# Patient Record
Sex: Male | Born: 1950
Health system: Southern US, Community
[De-identification: ages and names within clinical notes are randomized; demographics above are authoritative.]

## PROBLEM LIST (undated history)

## (undated) DIAGNOSIS — I2510 Atherosclerotic heart disease of native coronary artery without angina pectoris: Secondary | ICD-10-CM

## (undated) DIAGNOSIS — F329 Major depressive disorder, single episode, unspecified: Secondary | ICD-10-CM

## (undated) DIAGNOSIS — K219 Gastro-esophageal reflux disease without esophagitis: Secondary | ICD-10-CM

## (undated) DIAGNOSIS — G4730 Sleep apnea, unspecified: Secondary | ICD-10-CM

## (undated) DIAGNOSIS — I5189 Other ill-defined heart diseases: Secondary | ICD-10-CM

## (undated) DIAGNOSIS — I499 Cardiac arrhythmia, unspecified: Secondary | ICD-10-CM

## (undated) DIAGNOSIS — Z8719 Personal history of other diseases of the digestive system: Secondary | ICD-10-CM

## (undated) DIAGNOSIS — F419 Anxiety disorder, unspecified: Secondary | ICD-10-CM

## (undated) DIAGNOSIS — I119 Hypertensive heart disease without heart failure: Secondary | ICD-10-CM

## (undated) DIAGNOSIS — I10 Essential (primary) hypertension: Secondary | ICD-10-CM

## (undated) DIAGNOSIS — F32A Depression, unspecified: Secondary | ICD-10-CM

## (undated) HISTORY — DX: Atherosclerotic heart disease of native coronary artery without angina pectoris: I25.10

## (undated) HISTORY — DX: Other ill-defined heart diseases: I51.89

## (undated) HISTORY — PX: COLONOSCOPY: SHX174

## (undated) HISTORY — DX: Morbid (severe) obesity due to excess calories: E66.01

## (undated) HISTORY — PX: MOUTH SURGERY: SHX715

## (undated) HISTORY — PX: UPPER GI ENDOSCOPY: SHX6162

## (undated) HISTORY — DX: Hypertensive heart disease without heart failure: I11.9

---

## 1980-01-17 HISTORY — PX: GASTRIC BYPASS: SHX52

## 2001-08-08 ENCOUNTER — Encounter: Payer: Self-pay | Admitting: Internal Medicine

## 2001-08-08 ENCOUNTER — Ambulatory Visit (HOSPITAL_COMMUNITY): Admission: RE | Admit: 2001-08-08 | Discharge: 2001-08-08 | Payer: Self-pay | Admitting: Internal Medicine

## 2002-08-26 ENCOUNTER — Ambulatory Visit (HOSPITAL_COMMUNITY): Admission: RE | Admit: 2002-08-26 | Discharge: 2002-08-26 | Payer: Self-pay | Admitting: *Deleted

## 2002-08-26 ENCOUNTER — Encounter (INDEPENDENT_AMBULATORY_CARE_PROVIDER_SITE_OTHER): Payer: Self-pay | Admitting: Specialist

## 2007-01-24 ENCOUNTER — Ambulatory Visit (HOSPITAL_COMMUNITY): Admission: RE | Admit: 2007-01-24 | Discharge: 2007-01-24 | Payer: Self-pay | Admitting: *Deleted

## 2007-01-24 ENCOUNTER — Encounter (INDEPENDENT_AMBULATORY_CARE_PROVIDER_SITE_OTHER): Payer: Self-pay | Admitting: *Deleted

## 2007-03-27 ENCOUNTER — Ambulatory Visit (HOSPITAL_COMMUNITY): Admission: RE | Admit: 2007-03-27 | Discharge: 2007-03-27 | Payer: Self-pay | Admitting: *Deleted

## 2007-04-16 ENCOUNTER — Encounter: Admission: RE | Admit: 2007-04-16 | Discharge: 2007-04-16 | Payer: Self-pay | Admitting: *Deleted

## 2010-05-31 NOTE — Op Note (Signed)
NAME:  Gary [name], Gary [name]                ACCOUNT NO.:  [id number]   MEDICAL RECORD NO.:  [id number]          PATIENT TYPE:  AMB   LOCATION:  ENDO                         FACILITY:  [facility]   PHYSICIAN:  Georgiana Spinner, M.D.    DATE OF BIRTH:  [birth year]   DATE OF PROCEDURE:  01/24/2007  DATE OF DISCHARGE:                               OPERATIVE REPORT   PROCEDURE:  Colonoscopy.   INDICATIONS:  Colon polyps, colon cancer screening.   ANESTHESIA:  Fentanyl 25 mcg, Versed 2.5 mg, Benadryl 12.5 mg.   PROCEDURE:  With the patient mildly sedated in the left lateral  decubitus position, a rectal examination was attempted.  Subsequently,  the Pentax videoscopic colonoscope was inserted into the rectum and  passed under direct vision to the cecum identified by ileocecal valve  and appendiceal orifice, both of which were photographed.  From this  point the colonoscope was slowly withdrawn taking circumferential views  of colonic mucosa, stopping at 40 cm from the anal verge at which point  polyp was seen, photographed and removed using snare cautery technique  setting of 20/150 blended current.  We then suctioned this into the  endoscope for retrieval through a tissue trap and withdrew taking  circumferential views of the remaining colonic mucosa, stopping in the  rectum which appeared normal on direct and showed hemorrhoids on  retroflexed view.  The endoscope was straightened and withdrawn.  The  patient's vital signs and pulse oximeter remained stable.  The patient  tolerated the procedure well without apparent complication.   FINDINGS:  Polyp at 40 cm from the anal verge.  Await biopsy report.  The patient will call me for results and follow-up with me as an  outpatient.           ______________________________  Georgiana Spinner, M.D.     GMO/MEDQ  D:  01/24/2007  T:  01/24/2007  Job:  [id number]

## 2010-05-31 NOTE — Op Note (Signed)
NAME:  Gary [name], Gary [name]                ACCOUNT NO.:  [id number]   MEDICAL RECORD NO.:  [id number]          PATIENT TYPE:  AMB   LOCATION:  ENDO                         FACILITY:  [facility]   PHYSICIAN:  Georgiana Spinner, M.D.    DATE OF BIRTH:  [birth year]   DATE OF PROCEDURE:  01/24/2007  DATE OF DISCHARGE:                               OPERATIVE REPORT   PROCEDURE:  Upper endoscopy.   INDICATIONS:  GERD.   ANESTHESIA:  Fentanyl 150 mcg; Versed 15 mg; Benadryl 25 mg.   PROCEDURE:  With the patient mildly sedated in the left lateral  decubitus position, the Pentax videoscopic endoscope was inserted in the  mouth and passed under direct vision through the esophagus, which  appeared normal until we reached distal esophagus.  There was one area  of prominence at the squamocolumnar junction that I elected to  photograph and biopsy.  We entered into the stomach.  The patient had  previously a gastric bypass, and we were able to get through the banded  area into what was then a fairly copious stomach and in this area was  the pylorus which I could not reach with the scope but was photographed  from a distance only.  We placed the endoscope in retroflexion to view  the stomach from below and then withdrew the endoscope, taking  circumferential views of the remaining gastric and esophageal mucosa.  The patient's vital signs and pulse oximeter remained stable.  The  patient tolerated the procedure well without apparent complications.   FINDINGS:  Prominent area of squamocolumnar junction biopsied.  The  patient is status post gastric bypass surgery which at this point  appears to have failed incompletely.  I could not reach the duodenum  with the scope.   PLAN:  Await biopsy report.  The patient will call me for results and  follow up with me as an outpatient.  Proceed to colonoscopy as planned.           ______________________________  Georgiana Spinner, M.D.     GMO/MEDQ  D:  01/24/2007  T:   01/24/2007  Job:  [id number]

## 2010-06-03 NOTE — Op Note (Signed)
   NAMEMERRIT, Gary [name]                        ACCOUNT NO.:  [id number]   MEDICAL RECORD NO.:  [id number]                   PATIENT TYPE:  AMB   LOCATION:  ENDO                                 FACILITY:  [facility]   PHYSICIAN:  Georgiana Spinner, M.D.                 DATE OF BIRTH:  14-Jan-1951   DATE OF PROCEDURE:  08/26/2002  DATE OF DISCHARGE:                                 OPERATIVE REPORT   PROCEDURE:  Upper endoscopy.   INDICATIONS FOR PROCEDURE:  Gastroesophageal reflux disease.   ANESTHESIA:  Demerol 90, Versed 9 mg.   DESCRIPTION OF PROCEDURE:  With the patient mildly sedated in the left  lateral decubitus position the Olympus videoscopic endoscope was inserted in  the mouth and passed under direct vision through the esophagus. There was a  question of Barrett's which was seen, photographed and subsequently  biopsied.   We entered into the stomach  and there was a residual pouch from the  previous gastric bypass surgery. This was photographed. We were able to  enter into the remainder of the stomach  fairly easily and the fundus, body  and antrum were visualized and I could not get the scope to advance further  to see the duodenal bulb or 2nd portion of the duodenum.   The endoscope was then therefore  pulled back and placed in retroflexion and  viewed the stomach  from below and there was seen to be divided into parts  and this was photographed. The endoscope was then straightened and  withdrawn, taking circumferential  views of the remaining gastric and  esophageal  mucosa.   The patient's vital signs  and pulse oximeter remained stable. The patient  tolerated the procedure well without apparent complications.   FINDINGS:  The patient is status post  gastric pouch surgery. Otherwise an  unremarkable stomach. There appears to be an area of short segments  Barrett's esophagus which was photographed and biopsied.   PLAN:  Await biopsy report. The patient will call me for  results and follow  up with me as an outpatient.                                               Georgiana Spinner, M.D.    GMO/MEDQ  D:  08/26/2002  T:  08/26/2002  Job:  [id number]

## 2011-04-19 ENCOUNTER — Institutional Professional Consult (permissible substitution): Payer: Self-pay | Admitting: Pulmonary Disease

## 2013-07-25 ENCOUNTER — Encounter (HOSPITAL_BASED_OUTPATIENT_CLINIC_OR_DEPARTMENT_OTHER): Payer: Self-pay | Admitting: *Deleted

## 2013-07-25 ENCOUNTER — Other Ambulatory Visit: Payer: Self-pay | Admitting: Orthopedic Surgery

## 2013-07-25 NOTE — Progress Notes (Signed)
Does not use a cpap-he is supposed to -for reg finger cyst-needs bmet-ekg-will ck with dr crews

## 2013-07-28 ENCOUNTER — Encounter (HOSPITAL_BASED_OUTPATIENT_CLINIC_OR_DEPARTMENT_OTHER)
Admission: RE | Admit: 2013-07-28 | Discharge: 2013-07-28 | Disposition: A | Discharge status: Home / Self Care | Payer: BC Managed Care – PPO | Source: Ambulatory Visit | Attending: Orthopedic Surgery | Admitting: Orthopedic Surgery

## 2013-07-28 ENCOUNTER — Other Ambulatory Visit: Payer: Self-pay

## 2013-07-28 DIAGNOSIS — Z01818 Encounter for other preprocedural examination: Secondary | ICD-10-CM | POA: Insufficient documentation

## 2013-07-28 DIAGNOSIS — Z01812 Encounter for preprocedural laboratory examination: Secondary | ICD-10-CM | POA: Insufficient documentation

## 2013-07-28 DIAGNOSIS — Z01810 Encounter for preprocedural cardiovascular examination: Secondary | ICD-10-CM | POA: Insufficient documentation

## 2013-07-28 LAB — BASIC METABOLIC PANEL
Anion gap: 13 (ref 5–15)
BUN: 19 mg/dL (ref 6–23)
CHLORIDE: 101 meq/L (ref 96–112)
CO2: 25 meq/L (ref 19–32)
Calcium: 9 mg/dL (ref 8.4–10.5)
Creatinine, Ser: 1.18 mg/dL (ref 0.50–1.35)
GFR calc Af Amer: 75 mL/min — ABNORMAL LOW (ref >90)
GFR calc non Af Amer: 65 mL/min — ABNORMAL LOW (ref >90)
Glucose, Bld: 96 mg/dL (ref 70–99)
POTASSIUM: 4.2 meq/L (ref 3.7–5.3)
Sodium: 139 mEq/L (ref 137–147)

## 2013-07-28 NOTE — Progress Notes (Signed)
Dr crews ok with pt here

## 2013-07-30 ENCOUNTER — Ambulatory Visit (HOSPITAL_BASED_OUTPATIENT_CLINIC_OR_DEPARTMENT_OTHER)
Admission: RE | Admit: 2013-07-30 | Payer: BC Managed Care – PPO | Source: Ambulatory Visit | Admitting: Orthopedic Surgery

## 2013-07-30 HISTORY — DX: Depression, unspecified: F32.A

## 2013-07-30 HISTORY — DX: Gastro-esophageal reflux disease without esophagitis: K21.9

## 2013-07-30 HISTORY — DX: Anxiety disorder, unspecified: F41.9

## 2013-07-30 HISTORY — DX: Sleep apnea, unspecified: G47.30

## 2013-07-30 HISTORY — DX: Essential (primary) hypertension: I10

## 2013-07-30 HISTORY — DX: Major depressive disorder, single episode, unspecified: F32.9

## 2013-07-30 SURGERY — EXCISION MASS
Anesthesia: Regional | Site: Finger | Laterality: Right

## 2013-10-21 ENCOUNTER — Encounter (HOSPITAL_BASED_OUTPATIENT_CLINIC_OR_DEPARTMENT_OTHER): Payer: BC Managed Care – PPO | Attending: General Surgery

## 2014-08-04 ENCOUNTER — Ambulatory Visit
Admission: RE | Admit: 2014-08-04 | Discharge: 2014-08-04 | Disposition: A | Discharge status: Home / Self Care | Payer: BLUE CROSS/BLUE SHIELD | Source: Ambulatory Visit | Attending: Cardiology | Admitting: Cardiology

## 2014-08-04 ENCOUNTER — Other Ambulatory Visit: Payer: Self-pay | Admitting: Cardiology

## 2014-08-04 DIAGNOSIS — R0602 Shortness of breath: Secondary | ICD-10-CM

## 2014-08-07 ENCOUNTER — Other Ambulatory Visit: Payer: Self-pay | Admitting: Cardiology

## 2014-08-07 ENCOUNTER — Ambulatory Visit (HOSPITAL_COMMUNITY)
Admission: RE | Admit: 2014-08-07 | Discharge: 2014-08-07 | Disposition: A | Discharge status: Home / Self Care | Payer: BLUE CROSS/BLUE SHIELD | Source: Ambulatory Visit | Attending: Vascular Surgery | Admitting: Vascular Surgery

## 2014-08-07 DIAGNOSIS — R609 Edema, unspecified: Secondary | ICD-10-CM | POA: Diagnosis not present

## 2014-08-07 NOTE — Progress Notes (Signed)
Preliminary results phoned and left on voicemail, no one in office to give preliminary results to. Since results were negative I left them on Kathy(nurse-voicemail).

## 2014-08-17 ENCOUNTER — Other Ambulatory Visit: Payer: Self-pay | Admitting: Cardiology

## 2014-08-19 ENCOUNTER — Other Ambulatory Visit: Payer: Self-pay | Admitting: Cardiology

## 2014-08-19 DIAGNOSIS — I712 Thoracic aortic aneurysm, without rupture: Secondary | ICD-10-CM

## 2014-08-19 DIAGNOSIS — I7121 Aneurysm of the ascending aorta, without rupture: Secondary | ICD-10-CM

## 2014-08-21 ENCOUNTER — Ambulatory Visit
Admission: RE | Admit: 2014-08-21 | Discharge: 2014-08-21 | Disposition: A | Discharge status: Home / Self Care | Payer: BLUE CROSS/BLUE SHIELD | Source: Ambulatory Visit | Attending: Cardiology | Admitting: Cardiology

## 2014-08-21 DIAGNOSIS — I712 Thoracic aortic aneurysm, without rupture: Secondary | ICD-10-CM

## 2014-08-21 DIAGNOSIS — I7121 Aneurysm of the ascending aorta, without rupture: Secondary | ICD-10-CM

## 2014-09-07 ENCOUNTER — Encounter: Payer: Self-pay | Admitting: Cardiology

## 2014-09-07 ENCOUNTER — Other Ambulatory Visit: Payer: Self-pay | Admitting: Cardiology

## 2014-09-07 DIAGNOSIS — Z9884 Bariatric surgery status: Secondary | ICD-10-CM | POA: Insufficient documentation

## 2014-09-07 DIAGNOSIS — R0600 Dyspnea, unspecified: Secondary | ICD-10-CM | POA: Insufficient documentation

## 2014-09-07 DIAGNOSIS — I5189 Other ill-defined heart diseases: Secondary | ICD-10-CM | POA: Insufficient documentation

## 2014-09-07 DIAGNOSIS — I2510 Atherosclerotic heart disease of native coronary artery without angina pectoris: Secondary | ICD-10-CM | POA: Insufficient documentation

## 2014-09-07 DIAGNOSIS — K219 Gastro-esophageal reflux disease without esophagitis: Secondary | ICD-10-CM | POA: Insufficient documentation

## 2014-09-07 DIAGNOSIS — I119 Hypertensive heart disease without heart failure: Secondary | ICD-10-CM | POA: Insufficient documentation

## 2014-09-07 DIAGNOSIS — G4730 Sleep apnea, unspecified: Secondary | ICD-10-CM | POA: Insufficient documentation

## 2014-09-07 HISTORY — DX: Atherosclerotic heart disease of native coronary artery without angina pectoris: I25.10

## 2014-09-07 NOTE — Progress Notes (Unsigned)
Patient ID: Gary [name], male   DOB: December 09, 1950, 64 y.o.   MRN: [id number]   Gary, [name]  Date of visit:  09/07/2014 DOB:  1950-02-27    Age:  63 yrs. Medical record number:  [id number]     Account number:  [id number] Primary Care Provider: Merri Brunette ____________________________ CURRENT DIAGNOSES  1. CAD Native without angina  2. Dyspnea  3. Edema  4. Essential hypertension  5. Hypertensive heart disease without heart failure  6. Morbid (severe) obesity  7. Sleep apnea  8. Gastro-esophageal reflux disease  9. Ascending aortic aneurysm  10. Bariatric Surgery Status  11. Dyspnea ____________________________ ALLERGIES  No Known Allergies ____________________________ MEDICATIONS  1. aspirin 81 mg chewable tablet, 1 p.o. daily  2. multivitamin tablet, 1 p.o. daily  3. Nexium 40 mg capsule,delayed release, 1 p.o. daily  4. lisinopril 20 mg-hydrochlorothiazide 25 mg tablet, 1 p.o. daily  5. meloxicam 15 mg tablet, 1 p.o. daily  6. tramadol 50 mg tablet, PRN  7. rabeprazole 20 mg tablet,delayed release, 1 p.o. daily  8. ferrous sulfate 15 mg iron/1.5 mL oral suspension, 10ml qd  9. furosemide 20 mg tablet, 1 p.o. daily  10. Trintellix 20 mg tablet, 1 p.o. daily  11. atorvastatin 40 mg tablet, 1 p.o. daily ____________________________ CHIEF COMPLAINTS  F/u after ct scan ____________________________ HISTORY OF PRESENT ILLNESS Patient seen for cardiac followup. He continues to have fatigue and difficulty holding up stay mostly in level of activity. He complains of intermittent left arm pain that did not sound anginal to me. He is severely obese weighing over 400 pounds. Recent CT scan showed three-vessel coronary calcification and he also had an aortic root dimension of 3.7 cm suggesting an early aortic aneurysm. He continues to have edema despite taking furosemide 20 mg daily. He has no PND, orthopnea or claudication. Limited as to what he can do. He also had venous Dopplers did  show some reflux but one leg was difficult to image ____________________________ PAST HISTORY  Past Medical Illnesses:  restless legs, sleep apnea, depression, hypertension, GERD, obesity;  Cardiovascular Illnesses:  CAD, aortic aneurysm;  Surgical Procedures:  gastric bypass Rou en Y;  NYHA Classification:  II;  Canadian Angina Classification:  Class 0: Asymptomatic;  Cardiology Procedures-Invasive:  no history of prior cardiac procedures;  Cardiology Procedures-Noninvasive:  treadmill cardiolite;  LVEF of 60% documented via echocardiogram on 08/10/2014,   ____________________________ CARDIO-PULMONARY TEST DATES EKG Date:  08/04/2014;  Echocardiography Date: 08/10/2014;  Chest Xray Date: 08/04/2014;   ____________________________ FAMILY HISTORY Brother -- Brother alive and well Father -- Father dead, Heart disease Mother -- Mother dead, Congestive heart failure, Carcinoma of the pancreas Sister -- Sister alive with problem, Multiple sclerosis ____________________________ SOCIAL HISTORY Alcohol Use:  socially;  Smoking:  used to smoke but quit 2008, cigars;  Diet:  regular diet;  Lifestyle:  single;  Exercise:  no regular exercise;  Occupation:  Radiographer, therapeutic;  Residence:  lives alone;   ____________________________ REVIEW OF SYSTEMS General:  severe obesity, fatigue Respiratory: denies dyspnea, cough, wheezing or hemoptysis. Cardiovascular:  please review HPI Abdominal: dyspepsia and dysphagia Genitourinary-Male: nocturia  Musculoskeletal:  arthritis of the knees Neurological:  headaches Psychiatric:  anxiety, depression  ____________________________ PHYSICAL EXAMINATION VITAL SIGNS  Blood Pressure:  142/80 Sitting, Right arm, large cuff  , 146/86 Standing, Right arm and large cuff   Pulse:  86/min. Weight:  401.00 lbs. Height:  69"BMI: 59  Constitutional:  pleasant white male in no acute distress, severely obese  Skin:  striae over abdomen and arms Head:  normocephalic, normal  hair pattern, no masses or tenderness Neck:  supple, without massess. No JVD, thyromegaly or carotid bruits. Carotid upstroke normal. Chest:  normal symmetry, clear to auscultation. Cardiac:  regular rhythm, normal S1 and S2, No S3 or S4, no murmurs, gallops or rubs detected. Peripheral Pulses:  the femoral,dorsalis pedis, and posterior tibial pulses are full and equal bilaterally with no bruits auscultated. Extremities & Back:  2+ edema Neurological:  no gross motor or sensory deficits noted, affect appropriate, oriented x3. ____________________________ MOST RECENT LIPID PANEL 09/10/13  CHOL TOTL 198 mg/dl, LDL 161 NM, HDL 60 mg/dl and TRIGLYCER 70 mg/dl ____________________________ IMPRESSIONS/PLAN  1. 3 vessel coronary calcification 2. Significant exertional fatigue, malaise, and  dyspnea 3. Severe morbid obesity with weight greater than 400 pounds 4. Hypertensive heart disease with LVH and diastolic dysfunction  Recommendations:  Long discussion about his symptoms. He is severely limited and does have 3 vessel atherosclerosis. He is simply too large for standard stress testing or pharmacologic or perfusion stress testing. He is concerned about his cardiac risk and I told him that in light of his three-vessel calcification and symptoms that he could consider a radial catheterization if the cardiologist but doesn't feels he can safely perform one in him. I've asked for a consultation from the interventional cardiologist to determine if he would be a candidate from a weight and body habitus viewpoint for radial catheterization.  ____________________________ TODAYS ORDERS  1. Cardiology Interventional Schedule ASAP for consultation to determine suitablitlity for radial cath                       ____________________________ Cardiology Physician:  Darden Palmer MD [facility]

## 2014-09-14 ENCOUNTER — Ambulatory Visit (INDEPENDENT_AMBULATORY_CARE_PROVIDER_SITE_OTHER): Payer: BLUE CROSS/BLUE SHIELD | Admitting: Cardiovascular Disease

## 2014-09-14 ENCOUNTER — Encounter: Payer: Self-pay | Admitting: Cardiovascular Disease

## 2014-09-14 ENCOUNTER — Encounter: Payer: Self-pay | Admitting: *Deleted

## 2014-09-14 ENCOUNTER — Ambulatory Visit: Payer: BLUE CROSS/BLUE SHIELD | Admitting: Cardiovascular Disease

## 2014-09-14 VITALS — BP 110/70 | HR 82 | Ht 69.0 in | Wt >= 6400 oz

## 2014-09-14 DIAGNOSIS — R072 Precordial pain: Secondary | ICD-10-CM

## 2014-09-14 DIAGNOSIS — R0600 Dyspnea, unspecified: Secondary | ICD-10-CM | POA: Diagnosis not present

## 2014-09-14 DIAGNOSIS — I25110 Atherosclerotic heart disease of native coronary artery with unstable angina pectoris: Secondary | ICD-10-CM | POA: Diagnosis not present

## 2014-09-14 LAB — CBC
HEMATOCRIT: 35.8 % — AB (ref 39.0–52.0)
HEMOGLOBIN: 11.9 g/dL — AB (ref 13.0–17.0)
MCHC: 33.2 g/dL (ref 30.0–36.0)
MCV: 91.8 fl (ref 78.0–100.0)
Platelets: 172 10*3/uL (ref 150.0–400.0)
RBC: 3.9 Mil/uL — ABNORMAL LOW (ref 4.22–5.81)
RDW: 15.8 % — AB (ref 11.5–15.5)
WBC: 5.7 10*3/uL (ref 4.0–10.5)

## 2014-09-14 LAB — BASIC METABOLIC PANEL
BUN: 16 mg/dL (ref 6–23)
CALCIUM: 9.6 mg/dL (ref 8.4–10.5)
CO2: 30 mEq/L (ref 19–32)
CREATININE: 1.12 mg/dL (ref 0.40–1.50)
Chloride: 100 mEq/L (ref 96–112)
GFR: 70.43 mL/min (ref >60.00)
Glucose, Bld: 98 mg/dL (ref 70–99)
Potassium: 3.8 mEq/L (ref 3.5–5.1)
Sodium: 138 mEq/L (ref 135–145)

## 2014-09-14 LAB — PROTIME-INR
INR: 1 ratio (ref 0.8–1.0)
PROTHROMBIN TIME: 11.2 s (ref 9.6–13.1)

## 2014-09-14 NOTE — Progress Notes (Signed)
Chief Complaint  Patient presents with  . Shortness of Breath      History of Present Illness: 64 yo Gary [name] with history of HTN, LVH, OSA, CAD who is referred today by his cardiologist Dr. Viann Fish to discuss cardiac catheterization from the radial approach. He has not had prior cardiac workup. He describes occasional chest tightness described as a rope around his chest. This occurs at rest and with exertion. He has had progressive dyspnea with exertion and increasing fatigue. He has not worked for the last two months and does not exercise. He weighs over 400 lbs and limited by this.   Primary Cardiologist: Dr. Donnie Aho  Past Medical History  Diagnosis Date  . Depression   . Anxiety   . GERD (gastroesophageal reflux disease)   . Sleep apnea     does not use or have a cpap-he is supposed to use one  . CAD (coronary artery disease), native coronary artery 09/07/2014    3 vessel coronary calcification noted on CT scan 2016   . Hypertensive heart disease without CHF   . Diastolic dysfunction   . Morbid obesity     Past Surgical History  Procedure Laterality Date  . Upper gi endoscopy    . Colonoscopy    . Gastric bypass  1982  . Mouth surgery      Current Outpatient Prescriptions  Medication Sig Dispense Refill  . aspirin EC 81 MG tablet Take 81 mg by mouth daily.    Marland Kitchen atorvastatin (LIPITOR) 40 MG tablet Take 40 mg by mouth daily.    Marland Kitchen esomeprazole (NEXIUM) 20 MG capsule Take 20 mg by mouth daily at 12 noon.    . ferrous sulfate 325 (Gary FE) MG tablet Take 325 mg by mouth daily with breakfast.    . furosemide (LASIX) 20 MG tablet Take 20 mg by mouth daily.    Marland Kitchen lisinopril-hydrochlorothiazide (PRINZIDE,ZESTORETIC) 20-25 MG per tablet Take 1 tablet by mouth daily.    . meloxicam (MOBIC) 15 MG tablet Take 15 mg by mouth daily.    . Multiple Vitamin (MULTIVITAMIN) capsule Take 1 capsule by mouth daily.    Marland Kitchen rOPINIRole (REQUIP) 0.5 MG tablet Take 0.5 mg by mouth 3 (three) times  daily.    . traMADol (ULTRAM) 50 MG tablet Take by mouth every 6 (six) hours as needed.    . Vortioxetine HBr (TRINTELLIX) 20 MG TABS Take 20 mg by mouth daily.     No current facility-administered medications for this visit.    No Known Allergies  Social History   Social History  . Marital Status: Single    Spouse Name: N/A  . Number of Children: N/A  . Years of Education: N/A   Occupational History  . Not on file.   Social History Main Topics  . Smoking status: Former Smoker    Types: Cigars    Quit date: 01/16/2006  . Smokeless tobacco: Never Used  . Alcohol Use: 0.0 oz/week    0 Standard drinks or equivalent per week     Comment: occ  . Drug Use: No  . Sexual Activity: Not on file   Other Topics Concern  . Not on file   Social History Narrative   Lives alone.  Chartered certified accountant for [employer].     Family History  Problem Relation Age of Onset  . Heart attack Father   . Diabetes Father   . Heart failure Mother   . Pancreatic cancer Mother   . Multiple sclerosis  Sister   . Hypertension Maternal Aunt   . Diabetes Maternal Grandmother   . Heart attack Maternal Grandfather   . Diabetes Paternal Grandmother   . Heart attack Paternal Grandfather     Review of Systems:  As stated in the HPI and otherwise negative.   BP 110/70 mmHg  Pulse 82  Ht  (1.753 m)  Wt 410 lb 1.9 oz (186.029 kg)  BMI 60.54 kg/m2  SpO2 97%  Physical Examination: General: Well developed, well nourished, NAD HEENT: OP clear, mucus membranes moist SKIN: warm, dry. No rashes. Neuro: No focal deficits Musculoskeletal: Muscle strength 5/5 all ext Psychiatric: Mood and affect normal Neck: No JVD, no carotid bruits, no thyromegaly, no lymphadenopathy. Lungs:Clear bilaterally, no wheezes, rhonci, crackles Cardiovascular: Regular rate and rhythm. No murmurs, gallops or rubs. Abdomen:Soft. Bowel sounds present. Non-tender.  Extremities: No lower extremity edema. Unable to palpate pulses  due to leg size.   EKG:  EKG is ordered today. The ekg ordered today demonstrates NSR, rate 82 bpm. LAFB. Non-specific ST abnormality.   Recent Labs: No results found for requested labs within last 365 days.     Wt Readings from Last 3 Encounters:  09/14/14 410 lb 1.9 oz (186.029 kg)  07/25/13 385 lb (174.635 kg)     Other studies Reviewed: Additional studies/ records that were reviewed today include: . Review of the above records demonstrates:   Assessment and Plan:   1. CAD/Unstable angina/Dyspnea: He has three vessel CAD noted on CT scan. He is morbidly obese and has other risk factors for CAD including HTN and former tobacco abuse as well as FH of CAD. He has exertional chest pain, fatigue and dyspnea which may represent unstable angina. Will plan left heart catheterization from the right radial approach at [facility] on 09/23/14. Risks and benefits reviewed with pt. He agrees to proceed. Pre-cath labs today.   Current medicines are reviewed at length with the patient today.  The patient does not have concerns regarding medicines.  The following changes have been made:  no change  Labs/ tests ordered today include:   Orders Placed This Encounter  Procedures  . CBC  . Basic Metabolic Panel (BMET)  . INR/PT    Disposition:   FU with Dr. Donnie Aho post cath.   Signed, Verne Carrow, MD 09/14/2014 4:34 PM    [facility] [address] [place], [place], [place]  [address] Phone: [phone]; Fax: [phone]

## 2014-09-14 NOTE — Patient Instructions (Signed)
Medication Instructions:  Your physician recommends that you continue on your current medications as directed. Please refer to the Current Medication list given to you today.   Labwork: Lab work to be done today--CBC, [employer], PT  Testing/Procedures: Your physician has requested that you have a cardiac catheterization. Cardiac catheterization is used to diagnose and/or treat various heart conditions. Doctors may recommend this procedure for a number of different reasons. The most common reason is to evaluate chest pain. Chest pain can be a symptom of coronary artery disease (CAD), and cardiac catheterization can show whether plaque is narrowing or blocking your heart's arteries. This procedure is also used to evaluate the valves, as well as measure the blood flow and oxygen levels in different parts of your heart. For further information please visit [email or web address] Please follow instruction sheet, as given. Scheduled for September 23, 2014      Coronary Angiogram A coronary angiogram, also called coronary angiography, is an X-ray procedure used to look at the arteries in the heart. In this procedure, a dye (contrast dye) is injected through a long, hollow tube (catheter). The catheter is about the size of a piece of cooked spaghetti and is inserted through your groin, wrist, or arm. The dye is injected into each artery, and X-rays are then taken to show if there is a blockage in the arteries of your heart. LET [facility] CARE PROVIDER KNOW ABOUT:  Any allergies you have, including allergies to shellfish or contrast dye.   All medicines you are taking, including vitamins, herbs, eye drops, creams, and over-the-counter medicines.   Previous problems you or members of your family have had with the use of anesthetics.   Any blood disorders you have.   Previous surgeries you have had.  History of kidney problems or failure.   Other medical conditions you have. RISKS AND COMPLICATIONS    Generally, a coronary angiogram is a safe procedure. However, problems can occur and include:  Allergic reaction to the dye.  Bleeding from the access site or other locations.  Kidney injury, especially in people with impaired kidney function.  Stroke (rare).  Heart attack (rare). BEFORE THE PROCEDURE   Do not eat or drink anything after midnight the night before the procedure or as directed by your health care provider.   Ask your health care provider about changing or stopping your regular medicines. This is especially important if you are taking diabetes medicines or blood thinners. PROCEDURE  You may be given a medicine to help you relax (sedative) before the procedure. This medicine is given through an intravenous (IV) access tube that is inserted into one of your veins.   The area where the catheter will be inserted will be washed and shaved. This is usually done in the groin but may be done in the fold of your arm (near your elbow) or in the wrist.   A medicine will be given to numb the area where the catheter will be inserted (local anesthetic).   The health care provider will insert the catheter into an artery. The catheter will be guided by using a special type of X-ray (fluoroscopy) of the blood vessel being examined.   A special dye will then be injected into the catheter, and X-rays will be taken. The dye will help to show where any narrowing or blockages are located in the heart arteries.  AFTER THE PROCEDURE   If the procedure is done through the leg, you will be kept in bed lying flat  for several hours. You will be instructed to not bend or cross your legs.  The insertion site will be checked frequently.   The pulse in your feet or wrist will be checked frequently.   Additional blood tests, X-rays, and an electrocardiogram may be done.  Document Released: 07/09/2002 Document Revised: 05/19/2013 Document Reviewed: 05/27/2012 [facility] Patient  Information 2015 [place], [place]. This information is not intended to replace advice given to you by your health care provider. Make sure you discuss any questions you have with your health care provider.    Any Other Special Instructions Will Be Listed Below (If Applicable).

## 2014-09-15 ENCOUNTER — Ambulatory Visit: Payer: BLUE CROSS/BLUE SHIELD | Admitting: Cardiovascular Disease

## 2014-09-16 NOTE — Addendum Note (Signed)
Addended by: Dossie Arbour on: 09/16/2014 05:51 PM   Modules accepted: Orders

## 2014-09-23 ENCOUNTER — Ambulatory Visit (HOSPITAL_COMMUNITY)
Admission: RE | Admit: 2014-09-23 | Discharge: 2014-09-23 | Disposition: A | Discharge status: Home / Self Care | Payer: BLUE CROSS/BLUE SHIELD | Source: Ambulatory Visit | Attending: Cardiovascular Disease | Admitting: Cardiovascular Disease

## 2014-09-23 ENCOUNTER — Encounter (HOSPITAL_COMMUNITY)
Admission: RE | Disposition: A | Discharge status: Home / Self Care | Payer: Self-pay | Source: Ambulatory Visit | Attending: Cardiovascular Disease

## 2014-09-23 DIAGNOSIS — G4733 Obstructive sleep apnea (adult) (pediatric): Secondary | ICD-10-CM | POA: Diagnosis not present

## 2014-09-23 DIAGNOSIS — Z87891 Personal history of nicotine dependence: Secondary | ICD-10-CM | POA: Insufficient documentation

## 2014-09-23 DIAGNOSIS — I2510 Atherosclerotic heart disease of native coronary artery without angina pectoris: Secondary | ICD-10-CM | POA: Insufficient documentation

## 2014-09-23 DIAGNOSIS — I119 Hypertensive heart disease without heart failure: Secondary | ICD-10-CM | POA: Insufficient documentation

## 2014-09-23 DIAGNOSIS — I25110 Atherosclerotic heart disease of native coronary artery with unstable angina pectoris: Secondary | ICD-10-CM | POA: Insufficient documentation

## 2014-09-23 DIAGNOSIS — Z6841 Body Mass Index (BMI) 40.0 and over, adult: Secondary | ICD-10-CM | POA: Insufficient documentation

## 2014-09-23 DIAGNOSIS — Z7982 Long term (current) use of aspirin: Secondary | ICD-10-CM | POA: Insufficient documentation

## 2014-09-23 DIAGNOSIS — R072 Precordial pain: Secondary | ICD-10-CM | POA: Insufficient documentation

## 2014-09-23 HISTORY — PX: CARDIAC CATHETERIZATION: SHX172

## 2014-09-23 SURGERY — LEFT HEART CATH AND CORONARY ANGIOGRAPHY

## 2014-09-23 MED ORDER — SODIUM CHLORIDE 0.9 % IJ SOLN: 3.0000 mL | Freq: Two times a day (BID) | INTRAMUSCULAR | Status: DC

## 2014-09-23 MED ORDER — HEPARIN SODIUM (PORCINE) 1000 UNIT/ML IJ SOLN
INTRAMUSCULAR | Status: DC | PRN
  Administered 2014-09-23: 7000 [IU] via INTRAVENOUS

## 2014-09-23 MED ORDER — SODIUM CHLORIDE 0.9 % IV SOLN: 250.0000 mL | INTRAVENOUS | Status: DC | PRN

## 2014-09-23 MED ORDER — LIDOCAINE HCL (PF) 1 % IJ SOLN
INTRAMUSCULAR | Status: AC
  Filled 2014-09-23: qty 30

## 2014-09-23 MED ORDER — VERAPAMIL HCL 2.5 MG/ML IV SOLN
INTRAVENOUS | Status: AC
  Filled 2014-09-23: qty 2

## 2014-09-23 MED ORDER — SODIUM CHLORIDE 0.9 % IJ SOLN: 3.0000 mL | INTRAMUSCULAR | Status: DC | PRN

## 2014-09-23 MED ORDER — FENTANYL CITRATE (PF) 100 MCG/2ML IJ SOLN
INTRAMUSCULAR | Status: DC | PRN
  Administered 2014-09-23: 25 ug via INTRAVENOUS

## 2014-09-23 MED ORDER — VERAPAMIL HCL 2.5 MG/ML IV SOLN
INTRAVENOUS | Status: DC | PRN
  Administered 2014-09-23: 14:00:00 via INTRA_ARTERIAL

## 2014-09-23 MED ORDER — HEPARIN (PORCINE) IN NACL 2-0.9 UNIT/ML-% IJ SOLN
INTRAMUSCULAR | Status: AC
  Filled 2014-09-23: qty 1000

## 2014-09-23 MED ORDER — SODIUM CHLORIDE 0.9 % IV SOLN: INTRAVENOUS | Status: AC

## 2014-09-23 MED ORDER — HEPARIN (PORCINE) IN NACL 2-0.9 UNIT/ML-% IJ SOLN
INTRAMUSCULAR | Status: DC | PRN
  Administered 2014-09-23: 14:00:00

## 2014-09-23 MED ORDER — IOHEXOL 350 MG/ML SOLN
INTRAVENOUS | Status: DC | PRN
  Administered 2014-09-23: 125 mL via INTRA_ARTERIAL

## 2014-09-23 MED ORDER — MIDAZOLAM HCL 2 MG/2ML IJ SOLN
INTRAMUSCULAR | Status: AC
  Filled 2014-09-23: qty 4

## 2014-09-23 MED ORDER — ASPIRIN 81 MG PO CHEW: 81.0000 mg | CHEWABLE_TABLET | ORAL | Status: DC

## 2014-09-23 MED ORDER — HEPARIN SODIUM (PORCINE) 1000 UNIT/ML IJ SOLN
INTRAMUSCULAR | Status: AC
  Filled 2014-09-23: qty 1

## 2014-09-23 MED ORDER — SODIUM CHLORIDE 0.9 % IV SOLN
INTRAVENOUS | Status: AC
  Administered 2014-09-23: 13:00:00 via INTRAVENOUS

## 2014-09-23 MED ORDER — FENTANYL CITRATE (PF) 100 MCG/2ML IJ SOLN
INTRAMUSCULAR | Status: AC
  Filled 2014-09-23: qty 4

## 2014-09-23 MED ORDER — MIDAZOLAM HCL 2 MG/2ML IJ SOLN
INTRAMUSCULAR | Status: DC | PRN
  Administered 2014-09-23: 2 mg via INTRAVENOUS

## 2014-09-23 SURGICAL SUPPLY — 13 items
CATH INFINITI 5 FR JL3.5 (CATHETERS) ×2 IMPLANT
CATH INFINITI 5FR ANG PIGTAIL (CATHETERS) ×2 IMPLANT
CATH INFINITI JR4 5F (CATHETERS) ×2 IMPLANT
COVER PRB 48X5XTLSCP FOLD TPE (BAG) ×1 IMPLANT
COVER PROBE 5X48 (BAG) ×1
DEVICE RAD COMP TR BAND LRG (VASCULAR PRODUCTS) ×2 IMPLANT
GLIDESHEATH SLEND SS 6F .021 (SHEATH) ×2 IMPLANT
KIT HEART LEFT (KITS) ×2 IMPLANT
PACK CARDIAC CATHETERIZATION (CUSTOM PROCEDURE TRAY) ×2 IMPLANT
SYR MEDRAD MARK V 150ML (SYRINGE) ×2 IMPLANT
TRANSDUCER W/STOPCOCK (MISCELLANEOUS) ×2 IMPLANT
TUBING CIL FLEX 10 FLL-RA (TUBING) ×2 IMPLANT
WIRE SAFE-T 1.5MM-J .035X260CM (WIRE) ×2 IMPLANT

## 2014-09-23 NOTE — H&P (View-Only) (Signed)
 Chief Complaint  Patient presents with  . Shortness of Breath      History of Present Illness: 64 yo male with history of HTN, LVH, OSA, CAD who is referred today by his cardiologist Dr. Spencer Tilley to discuss cardiac catheterization from the radial approach. He has not had prior cardiac workup. He describes occasional chest tightness described as a rope around his chest. This occurs at rest and with exertion. He has had progressive dyspnea with exertion and increasing fatigue. He has not worked for the last two months and does not exercise. He weighs over 400 lbs and limited by this.   Primary Cardiologist: Dr. Tilley  Past Medical History  Diagnosis Date  . Depression   . Anxiety   . GERD (gastroesophageal reflux disease)   . Sleep apnea     does not use or have a cpap-he is supposed to use one  . CAD (coronary artery disease), native coronary artery 09/07/2014    3 vessel coronary calcification noted on CT scan 2016   . Hypertensive heart disease without CHF   . Diastolic dysfunction   . Morbid obesity     Past Surgical History  Procedure Laterality Date  . Upper gi endoscopy    . Colonoscopy    . Gastric bypass  1982  . Mouth surgery      Current Outpatient Prescriptions  Medication Sig Dispense Refill  . aspirin EC 81 MG tablet Take 81 mg by mouth daily.    . atorvastatin (LIPITOR) 40 MG tablet Take 40 mg by mouth daily.    . esomeprazole (NEXIUM) 20 MG capsule Take 20 mg by mouth daily at 12 noon.    . ferrous sulfate 325 (65 FE) MG tablet Take 325 mg by mouth daily with breakfast.    . furosemide (LASIX) 20 MG tablet Take 20 mg by mouth daily.    . lisinopril-hydrochlorothiazide (PRINZIDE,ZESTORETIC) 20-25 MG per tablet Take 1 tablet by mouth daily.    . meloxicam (MOBIC) 15 MG tablet Take 15 mg by mouth daily.    . Multiple Vitamin (MULTIVITAMIN) capsule Take 1 capsule by mouth daily.    . rOPINIRole (REQUIP) 0.5 MG tablet Take 0.5 mg by mouth 3 (three) times  daily.    . traMADol (ULTRAM) 50 MG tablet Take by mouth every 6 (six) hours as needed.    . Vortioxetine HBr (TRINTELLIX) 20 MG TABS Take 20 mg by mouth daily.     No current facility-administered medications for this visit.    No Known Allergies  Social History   Social History  . Marital Status: Single    Spouse Name: N/A  . Number of Children: N/A  . Years of Education: N/A   Occupational History  . Not on file.   Social History Main Topics  . Smoking status: Former Smoker    Types: Cigars    Quit date: 01/16/2006  . Smokeless tobacco: Never Used  . Alcohol Use: 0.0 oz/week    0 Standard drinks or equivalent per week     Comment: occ  . Drug Use: No  . Sexual Activity: Not on file   Other Topics Concern  . Not on file   Social History Narrative   Lives alone.  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     Family History  Problem Relation Age of Onset  . Heart attack Father   . Diabetes Father   . Heart failure Mother   . Pancreatic cancer Mother   . Multiple sclerosis   Sister   . Hypertension Maternal Aunt   . Diabetes Maternal Grandmother   . Heart attack Maternal Grandfather   . Diabetes Paternal Grandmother   . Heart attack Paternal Grandfather     Review of Systems:  As stated in the HPI and otherwise negative.   BP 110/70 mmHg  Pulse 82  Ht 5' 9" (1.753 m)  Wt 410 lb 1.9 oz (186.029 kg)  BMI 60.54 kg/m2  SpO2 97%  Physical Examination: General: Well developed, well nourished, NAD HEENT: OP clear, mucus membranes moist SKIN: warm, dry. No rashes. Neuro: No focal deficits Musculoskeletal: Muscle strength 5/5 all ext Psychiatric: Mood and affect normal Neck: No JVD, no carotid bruits, no thyromegaly, no lymphadenopathy. Lungs:Clear bilaterally, no wheezes, rhonci, crackles Cardiovascular: Regular rate and rhythm. No murmurs, gallops or rubs. Abdomen:Soft. Bowel sounds present. Non-tender.  Extremities: No lower extremity edema. Unable to palpate pulses  due to leg size.   EKG:  EKG is ordered today. The ekg ordered today demonstrates NSR, rate 82 bpm. LAFB. Non-specific ST abnormality.   Recent Labs: No results found for requested labs within last 365 days.     Wt Readings from Last 3 Encounters:  09/14/14 410 lb 1.9 oz (186.029 kg)  07/25/13 385 lb (174.635 kg)     Other studies Reviewed: Additional studies/ records that were reviewed today include: . Review of the above records demonstrates:   Assessment and Plan:   1. CAD/Unstable angina/Dyspnea: He has three vessel CAD noted on CT scan. He is morbidly obese and has other risk factors for CAD including HTN and former tobacco abuse as well as FH of CAD. He has exertional chest pain, fatigue and dyspnea which may represent unstable angina. Will plan left heart catheterization from the right radial approach at [facility] on 09/23/14. Risks and benefits reviewed with pt. He agrees to proceed. Pre-cath labs today.   Current medicines are reviewed at length with the patient today.  The patient does not have concerns regarding medicines.  The following changes have been made:  no change  Labs/ tests ordered today include:   Orders Placed This Encounter  Procedures  . CBC  . Basic Metabolic Panel (BMET)  . INR/PT    Disposition:   FU with Dr. tilley post cath.   Signed, Christopher McAlhany, MD 09/14/2014 4:34 PM    [facility] [address], [place], [place]  [address] Phone: [phone]; Fax: [phone]     

## 2014-09-23 NOTE — Discharge Instructions (Signed)
Radial Site Care °Refer to this sheet in the next few weeks. These instructions provide you with information on caring for yourself after your procedure. Your caregiver may also give you more specific instructions. Your treatment has been planned according to current medical practices, but problems sometimes occur. Call your caregiver if you have any problems or questions after your procedure. °HOME CARE INSTRUCTIONS °· You may shower the day after the procedure. Remove the bandage (dressing) and gently wash the site with plain soap and water. Gently pat the site dry. °· Do not apply powder or lotion to the site. °· Do not submerge the affected site in water for 3 to 5 days. °· Inspect the site at least twice daily. °· Do not flex or bend the affected arm for 24 hours. °· No lifting over 5 pounds (2.3 kg) for 5 days after your procedure. °· Do not drive home if you are discharged the same day of the procedure. Have someone else drive you. °· You may drive 24 hours after the procedure unless otherwise instructed by your caregiver. °· Do not operate machinery or power tools for 24 hours. °· A responsible adult should be with you for the first 24 hours after you arrive home. °What to expect: °· Any bruising will usually fade within 1 to 2 weeks. °· Blood that collects in the tissue (hematoma) may be painful to the touch. It should usually decrease in size and tenderness within 1 to 2 weeks. °SEEK IMMEDIATE MEDICAL CARE IF: °· You have unusual pain at the radial site. °· You have redness, warmth, swelling, or pain at the radial site. °· You have drainage (other than a small amount of blood on the dressing). °· You have chills. °· You have a fever or persistent symptoms for more than 72 hours. °· You have a fever and your symptoms suddenly get worse. °· Your arm becomes pale, cool, tingly, or numb. °· You have heavy bleeding from the site. Hold pressure on the site and call 911. °Document Released: 02/04/2010 Document  Revised: 03/27/2011 Document Reviewed: 02/04/2010 °ExitCare® Patient Information ©2015 [employer]. This information is not intended to replace advice given to you by your health care provider. Make sure you discuss any questions you have with your health care provider. ° °

## 2014-09-23 NOTE — Interval H&P Note (Signed)
History and Physical Interval Note:  09/23/2014 11:31 AM  Gary [name]  has presented today for cardiac cath with the diagnosis of unstable angina. The various methods of treatment have been discussed with the patient and family. After consideration of risks, benefits and other options for treatment, the patient has consented to  Procedure(s): Left Heart Cath and Coronary Angiography (N/A) as a surgical intervention .  The patient's history has been reviewed, patient examined, no change in status, stable for surgery.  I have reviewed the patient's chart and labs.  Questions were answered to the patient's satisfaction.    Cath Lab Visit (complete for each Cath Lab visit)  Clinical Evaluation Leading to the Procedure:   ACS: No.  Non-ACS:    Anginal Classification: CCS III  Anti-ischemic medical therapy: No Therapy  Non-Invasive Test Results: No non-invasive testing performed  Prior CABG: No previous CABG         Gary [name]

## 2014-09-24 ENCOUNTER — Encounter (HOSPITAL_COMMUNITY): Payer: Self-pay | Admitting: Cardiovascular Disease

## 2014-11-10 ENCOUNTER — Encounter: Payer: Self-pay | Admitting: Diagnostic Neuroimaging

## 2014-11-10 ENCOUNTER — Ambulatory Visit (INDEPENDENT_AMBULATORY_CARE_PROVIDER_SITE_OTHER): Payer: BLUE CROSS/BLUE SHIELD | Admitting: Diagnostic Neuroimaging

## 2014-11-10 VITALS — BP 110/69 | HR 75 | Ht 68.0 in | Wt >= 6400 oz

## 2014-11-10 DIAGNOSIS — R208 Other disturbances of skin sensation: Secondary | ICD-10-CM | POA: Diagnosis not present

## 2014-11-10 DIAGNOSIS — R51 Headache: Secondary | ICD-10-CM

## 2014-11-10 DIAGNOSIS — R200 Anesthesia of skin: Secondary | ICD-10-CM

## 2014-11-10 DIAGNOSIS — R519 Headache, unspecified: Secondary | ICD-10-CM

## 2014-11-10 NOTE — Progress Notes (Signed)
[facility]  PATIENT: Gary [name] DOB: 1950/03/31  REFERRING CLINICIAN: Franchot Mimes  HISTORY FROM: patient  REASON FOR VISIT: new consult    HISTORICAL  CHIEF COMPLAINT:  Chief Complaint  Patient presents with  . Numbness    rm 6, New Patient, "burning, weakness right leg"    HISTORY OF PRESENT ILLNESS:   64 year old right-handed male here for evaluation of burning pain and weakness in right leg.  For past 6-7 months patient developed onset of right lateral and anterior thigh burning sensation, soreness, weakness and numbness. No specific triggering or alleviating factors. Also has numbness sensation from bilateral knees down to bilateral feet and toes. Sometimes his Muscles hurt.  Sometimes has shooting pain in the left jaw. Sometimes has drooling from the left side.  Patient has a history of significant obesity, status post gastric bypass surgery in 1982. Initially lost 175 pounds but gradually regained this over the next 10 years. Apparently he has developed a gastric fistula and may need revision of this.  Patient also having increasing fatigue, memory loss confusion depression and anxiety.   REVIEW OF SYSTEMS: Full 14 system review of systems performed and notable only for fatigue chest pain swelling in legs short of breath snoring blurred vision anemia easy bruising easy bleeding feeling cold joint pain joint swelling cramps aching muscles depression anxiety decreased energy dissection activities rest his legs numbness weakness confusion memory loss dizziness.  ALLERGIES: No Known Allergies  HOME MEDICATIONS: Outpatient Prescriptions Prior to Visit  Medication Sig Dispense Refill  . aspirin EC 81 MG tablet Take 81 mg by mouth daily.    Marland Kitchen atorvastatin (LIPITOR) 40 MG tablet Take 40 mg by mouth daily.    . ferrous sulfate 325 (65 FE) MG tablet Take 325 mg by mouth daily with breakfast.    . furosemide (LASIX) 20 MG tablet Take 20 mg by mouth daily.     Marland Kitchen lisinopril-hydrochlorothiazide (PRINZIDE,ZESTORETIC) 20-25 MG per tablet Take 1 tablet by mouth daily.    . meloxicam (MOBIC) 15 MG tablet Take 15 mg by mouth daily.    . Multiple Vitamin (MULTIVITAMIN) capsule Take 1 capsule by mouth daily.    Marland Kitchen omeprazole (PRILOSEC) 20 MG capsule Take 20 mg by mouth daily.    Marland Kitchen rOPINIRole (REQUIP) 0.5 MG tablet Take 0.5 mg by mouth 3 (three) times daily.    . traMADol (ULTRAM) 50 MG tablet Take 50 mg by mouth every 6 (six) hours as needed for moderate pain.     . Vortioxetine HBr (TRINTELLIX) 20 MG TABS Take 20 mg by mouth daily.     No facility-administered medications prior to visit.    PAST MEDICAL HISTORY: Past Medical History  Diagnosis Date  . Depression   . Anxiety   . GERD (gastroesophageal reflux disease)   . Sleep apnea     does not use or have a cpap-he is supposed to use one  . CAD (coronary artery disease), native coronary artery 09/07/2014    3 vessel coronary calcification noted on CT scan 2016   . Hypertensive heart disease without CHF   . Diastolic dysfunction   . Morbid obesity (HCC)     PAST SURGICAL HISTORY: Past Surgical History  Procedure Laterality Date  . Upper gi endoscopy    . Colonoscopy    . Gastric bypass  1982  . Mouth surgery    . Cardiac catheterization N/A 09/23/2014    Procedure: Left Heart Cath and Coronary Angiography;  Surgeon: Cristal Deer  Ellene Route McAlhany, MD;  Location: [facility] INVASIVE [facility];  Service: Cardiovascular;  Laterality: N/A;    FAMILY HISTORY: Family History  Problem Relation Age of Onset  . Heart attack Father   . Diabetes Father   . Heart failure Mother   . Pancreatic cancer Mother   . Multiple sclerosis Sister   . Hypertension Maternal Aunt   . Diabetes Maternal Grandmother   . Heart attack Maternal Grandfather   . Diabetes Paternal Grandmother   . Heart attack Paternal Grandfather   . Hypertension Brother     SOCIAL HISTORY:  Social History   Social History  . Marital Status:  Single    Spouse Name: N/A  . Number of Children: 0  . Years of Education: 16   Occupational History  .      [employer]   Social History Main Topics  . Smoking status: Former Smoker    Types: Cigars    Quit date: 01/16/2006  . Smokeless tobacco: Never Used  . Alcohol Use: 0.0 oz/week    0 Standard drinks or equivalent per week     Comment: occ  . Drug Use: No  . Sexual Activity: Not on file   Other Topics Concern  . Not on file   Social History Narrative   Lives alone.  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    Caffeine use- coffee 2 cups /day     PHYSICAL EXAM  GENERAL EXAM/CONSTITUTIONAL: Vitals:  Filed Vitals:   11/10/14 1109  BP: 110/69  Pulse: 75  Height: 5\' 8"  (1.727 m)  Weight: 409 lb (185.521 kg)     Body mass index is 62.2 kg/(m^2).  Visual Acuity Screening   Right eye Left eye Both eyes  Without correction:     With correction: 20/30 20/30      Patient is in no distress; well developed, nourished and groomed; neck is supple  CARDIOVASCULAR:  Examination of carotid arteries is normal; no carotid bruits  Regular rate and rhythm, no murmurs  Examination of peripheral vascular system by observation and palpation is normal  EYES:  Ophthalmoscopic exam of optic discs and posterior segments is normal; no papilledema or hemorrhages  MUSCULOSKELETAL:  Gait, strength, tone, movements noted in Neurologic exam below  NEUROLOGIC: MENTAL STATUS:  No flowsheet data found.  awake, alert, oriented to person, place and time  recent and remote memory intact  normal attention and concentration  language fluent, comprehension intact, naming intact,   fund of knowledge appropriate  CRANIAL NERVE:   2nd - no papilledema on fundoscopic exam  2nd, 3rd, 4th, 6th - pupils equal and reactive to light, visual fields full to confrontation, extraocular muscles intact, no nystagmus  5th - facial sensation symmetric  7th - facial strength symmetric  8th -  hearing intact  9th - palate elevates symmetrically, uvula midline  11th - shoulder shrug symmetric  12th - tongue protrusion midline  MOTOR:   normal bulk and tone, full strength in the BUE, BLE  SENSORY:   normal and symmetric to light touch, pinprick, temperature, vibration  COORDINATION:   finger-nose-finger, fine finger movements normal  REFLEXES:   deep tendon reflexes TRACE and symmetric  GAIT/STATION:   narrow based gait; ANTALGIC GAIT    DIAGNOSTIC DATA (LABS, IMAGING, TESTING) - I reviewed patient records, labs, notes, testing and imaging myself where available.  Lab Results  Component Value Date   WBC 5.7 09/14/2014   HGB 11.9* 09/14/2014   HCT 35.8* 09/14/2014   MCV 91.8 09/14/2014  PLT 172.0 09/14/2014      Component Value Date/Time   NA 138 09/14/2014 1511   K 3.8 09/14/2014 1511   CL 100 09/14/2014 1511   CO2 30 09/14/2014 1511   GLUCOSE 98 09/14/2014 1511   BUN 16 09/14/2014 1511   CREATININE 1.12 09/14/2014 1511   CALCIUM 9.6 09/14/2014 1511   GFRNONAA 65* 07/28/2013 1030   GFRAA 75* 07/28/2013 1030   No results found for: CHOL, HDL, LDLCALC, LDLDIRECT, TRIG, CHOLHDL No results found for: ZOXW9U No results found for: VITAMINB12 No results found for: TSH   08/09/01 MRI brain  - NORMAL MRI OF THE BRAIN AND SKULL. PROBABLE REACTIVE LYMPH NODE IN THE SUBCUTANEOUS FATTY TISSUES POSTERIOR TO THE LEFT MASTOID PROCESS.    ASSESSMENT AND PLAN  64 y.o. year old male here with for evaluation of right lateral thigh numbness, burning pain, most consistent with meralgia paresthetica related to morbid obesity. Advised patient to continue focusing on weight management and weight reduction. We'll check MRI lumbar spine to rule out lumbar radiculopathy. May consider topical neuropathy cream as well.  Regarding other symptoms including left face numbness, drooling, pain sensation will check MRI of the brain to rule out CNS etiologies.  Dx:  Left  facial pain - Plan: MR Brain W Wo Contrast, MR Lumbar Spine Wo Contrast  Numbness of right anterior thigh - Plan: MR Brain W Wo Contrast, MR Lumbar Spine Wo Contrast  Bilateral leg numbness - Plan: MR Brain W Wo Contrast, MR Lumbar Spine Wo Contrast    PLAN:  Orders Placed This Encounter  Procedures  . MR Brain W Wo Contrast  . MR Lumbar Spine Wo Contrast   Return in about 6 weeks (around 12/22/2014).    Suanne Marker, MD 11/10/2014, 12:09 PM Certified in Neurology, Neurophysiology and Neuroimaging  [facility] [address], [address] [place], [place] [address] [phone]

## 2014-11-10 NOTE — Patient Instructions (Signed)
Thank you for coming to see [place] at [facility]. I hope we have been able to provide you high quality care today.  You may receive a patient satisfaction survey over the next few weeks. We would appreciate your feedback and comments so that we may continue to improve ourselves and the health of our patients.  - I will check MRI brain and lumbar spine   ~~~~~~~~~~~~~~~~~~~~~~~~~~~~~~~~~~~~~~~~~~~~~~~~~~~~~~~~~~~~~~~~~  DR. Conleigh Heinlein'S GUIDE TO HAPPY AND HEALTHY LIVING These are some of my general health and wellness recommendations. Some of them may apply to you better than others. Please use common sense as you try these suggestions and feel free to ask me any questions.   ACTIVITY/FITNESS Mental, social, emotional and physical stimulation are very important for brain and body health. Try learning a new activity (arts, music, language, sports, games).  Keep moving your body to the best of your abilities. You can do this at home, inside or outside, the park, community center, gym or anywhere you like. Consider a physical therapist or personal trainer to get started. Consider the app Sworkit. Fitness trackers such as smart-watches, smart-phones or Fitbits can help as well.   NUTRITION Eat more plants: colorful vegetables, nuts, seeds and berries.  Eat less sugar, salt, preservatives and processed foods.  Avoid toxins such as cigarettes and alcohol.  Drink water when you are thirsty. Warm water with a slice of lemon is an excellent morning drink to start the day.  Consider these websites for more information The Nutrition Source (https://www.henry-hernandez.biz/) Precision Nutrition (WindowBlog.ch)   RELAXATION Consider practicing mindfulness meditation or other relaxation techniques such as deep breathing, prayer, yoga, tai chi, massage. See website mindful.org or the apps Headspace or Calm to help get  started.   SLEEP Try to get at least 7-8+ hours sleep per day. Regular exercise and reduced caffeine will help you sleep better. Practice good sleep hygeine techniques. See website sleep.org for more information.   PLANNING Prepare estate planning, living will, healthcare POA documents. Sometimes this is best planned with the help of an attorney. Theconversationproject.org and agingwithdignity.org are excellent resources.

## 2014-11-17 ENCOUNTER — Ambulatory Visit (INDEPENDENT_AMBULATORY_CARE_PROVIDER_SITE_OTHER): Payer: BLUE CROSS/BLUE SHIELD

## 2014-11-17 ENCOUNTER — Ambulatory Visit (INDEPENDENT_AMBULATORY_CARE_PROVIDER_SITE_OTHER): Payer: Self-pay

## 2014-11-17 DIAGNOSIS — R51 Headache: Secondary | ICD-10-CM | POA: Diagnosis not present

## 2014-11-17 DIAGNOSIS — R200 Anesthesia of skin: Secondary | ICD-10-CM

## 2014-11-17 DIAGNOSIS — R519 Headache, unspecified: Secondary | ICD-10-CM

## 2014-11-17 DIAGNOSIS — Z0289 Encounter for other administrative examinations: Secondary | ICD-10-CM

## 2014-11-17 DIAGNOSIS — R208 Other disturbances of skin sensation: Secondary | ICD-10-CM

## 2014-11-21 ENCOUNTER — Encounter: Payer: Self-pay | Admitting: Diagnostic Neuroimaging

## 2014-11-24 ENCOUNTER — Telehealth: Payer: Self-pay | Admitting: Diagnostic Neuroimaging

## 2014-11-24 NOTE — Telephone Encounter (Signed)
Patient called to request results of 11/17/14 MRI. Please call [phone].

## 2014-11-25 NOTE — Telephone Encounter (Signed)
Spoke with patient and informed him, per Dr Marjory LiesPenumalli, his MRI brain is normal, but his MRI lumbar spine shows spinal stenosis. Informed him Dr Marjory LiesPenumalli would be happy to refer him to a neurosurgeon. Patient states he would like that referral but also wants to come in earlier than his Dec FU to discuss MRI results "in detail". Rescheduled his FU for 11/26/14; he verbalized understanding, appreciation. Also cancelled his Dec FU at his request.

## 2014-11-26 ENCOUNTER — Encounter: Payer: Self-pay | Admitting: Diagnostic Neuroimaging

## 2014-11-26 ENCOUNTER — Ambulatory Visit (INDEPENDENT_AMBULATORY_CARE_PROVIDER_SITE_OTHER): Payer: BLUE CROSS/BLUE SHIELD | Admitting: Diagnostic Neuroimaging

## 2014-11-26 VITALS — BP 109/68 | HR 86 | Ht 68.0 in | Wt >= 6400 oz

## 2014-11-26 DIAGNOSIS — R519 Headache, unspecified: Secondary | ICD-10-CM | POA: Insufficient documentation

## 2014-11-26 DIAGNOSIS — M4806 Spinal stenosis, lumbar region: Secondary | ICD-10-CM

## 2014-11-26 DIAGNOSIS — M48061 Spinal stenosis, lumbar region without neurogenic claudication: Secondary | ICD-10-CM

## 2014-11-26 DIAGNOSIS — R51 Headache: Secondary | ICD-10-CM | POA: Diagnosis not present

## 2014-11-26 DIAGNOSIS — R200 Anesthesia of skin: Secondary | ICD-10-CM

## 2014-11-26 DIAGNOSIS — R208 Other disturbances of skin sensation: Secondary | ICD-10-CM | POA: Diagnosis not present

## 2014-11-26 NOTE — Progress Notes (Signed)
[facility]  PATIENT: Gary [name] DOB: 05/09/1950  REFERRING CLINICIAN: Franchot Mimes  HISTORY FROM: patient  REASON FOR VISIT: follow up    HISTORICAL  CHIEF COMPLAINT:  Chief Complaint  Patient presents with  . Pain    rm 6, left facial pain, review MRI results  . Follow-up    3 week    HISTORY OF PRESENT ILLNESS:   UPDATE 11/26/14: Since last visit, no new events. Here to review MRI results.  PRIOR HPI (11/10/14:) 64 year old right-handed male here for evaluation of burning pain and weakness in right leg. For past 6-7 months patient developed onset of right lateral and anterior thigh burning sensation, soreness, weakness and numbness. No specific triggering or alleviating factors. Also has numbness sensation from bilateral knees down to bilateral feet and toes. Sometimes his muscles hurt. Sometimes has shooting pain in the left jaw. Sometimes has drooling from the left side. Patient has a history of significant obesity, status post gastric bypass surgery in 1982. Initially lost 175 pounds but gradually regained this over the next 10 years. Apparently he has developed a gastric fistula and may need revision of this. Patient also having increasing fatigue, memory loss confusion depression and anxiety.    REVIEW OF SYSTEMS: Full 14 system review of systems performed and notable only for fatigue.  ALLERGIES:  No Known Allergies  HOME MEDICATIONS: Outpatient Prescriptions Prior to Visit  Medication Sig Dispense Refill  . aspirin EC 81 MG tablet Take 81 mg by mouth daily.    Marland Kitchen atorvastatin (LIPITOR) 40 MG tablet Take 40 mg by mouth daily.    . ferrous sulfate 325 (65 FE) MG tablet Take 325 mg by mouth daily with breakfast.    . furosemide (LASIX) 20 MG tablet Take 20 mg by mouth daily.    Marland Kitchen lisinopril-hydrochlorothiazide (PRINZIDE,ZESTORETIC) 20-25 MG per tablet Take 1 tablet by mouth daily.    . meloxicam (MOBIC) 15 MG tablet Take 15 mg by mouth daily.    .  Multiple Vitamin (MULTIVITAMIN) capsule Take 1 capsule by mouth daily.    Marland Kitchen omeprazole (PRILOSEC) 20 MG capsule Take 20 mg by mouth daily.    . polyethylene glycol (MIRALAX / GLYCOLAX) packet Take 17 g by mouth daily.    Marland Kitchen rOPINIRole (REQUIP) 0.5 MG tablet Take 0.5 mg by mouth 3 (three) times daily.    . traMADol (ULTRAM) 50 MG tablet Take 50 mg by mouth every 6 (six) hours as needed for moderate pain.     . Vortioxetine HBr (TRINTELLIX) 20 MG TABS Take 20 mg by mouth daily.     No facility-administered medications prior to visit.    PAST MEDICAL HISTORY: Past Medical History  Diagnosis Date  . Depression   . Anxiety   . GERD (gastroesophageal reflux disease)   . Sleep apnea     does not use or have a cpap-he is supposed to use one  . CAD (coronary artery disease), native coronary artery 09/07/2014    3 vessel coronary calcification noted on CT scan 2016   . Hypertensive heart disease without CHF   . Diastolic dysfunction   . Morbid obesity (HCC)     PAST SURGICAL HISTORY: Past Surgical History  Procedure Laterality Date  . Upper gi endoscopy    . Colonoscopy    . Gastric bypass  1982  . Mouth surgery    . Cardiac catheterization N/A 09/23/2014    Procedure: Left Heart Cath and Coronary Angiography;  Surgeon: Nile Dear  Clifton James, MD;  Location: [facility];  Service: Cardiovascular;  Laterality: N/A;    FAMILY HISTORY: Family History  Problem Relation Age of Onset  . Heart attack Father   . Diabetes Father   . Heart failure Mother   . Pancreatic cancer Mother   . Multiple sclerosis Sister   . Hypertension Maternal Aunt   . Diabetes Maternal Grandmother   . Heart attack Maternal Grandfather   . Diabetes Paternal Grandmother   . Heart attack Paternal Grandfather   . Hypertension Brother   . Cancer - Colon Brother     SOCIAL HISTORY:  Social History   Social History  . Marital Status: Single    Spouse Name: N/A  . Number of Children: 0  . Years of  Education: 16   Occupational History  .      [employer]   Social History Main Topics  . Smoking status: Former Smoker    Types: Cigars    Quit date: 01/16/2006  . Smokeless tobacco: Never Used  . Alcohol Use: 0.0 oz/week    0 Standard drinks or equivalent per week     Comment: occ  . Drug Use: No  . Sexual Activity: Not on file   Other Topics Concern  . Not on file   Social History Narrative   Lives alone.  Chartered certified accountant for [employer].    Caffeine use- coffee 2 cups /day     PHYSICAL EXAM  GENERAL EXAM/CONSTITUTIONAL: Vitals:  Filed Vitals:   11/26/14 1420  BP: 109/68  Pulse: 86  Height:  (1.727 m)  Weight: 406 lb (184.16 kg)   Body mass index is 61.75 kg/(m^2). No exam data present  Patient is in no distress; well developed, nourished and groomed; neck is supple  CARDIOVASCULAR:  Examination of carotid arteries is normal; no carotid bruits  Regular rate and rhythm, no murmurs  Examination of peripheral vascular system by observation and palpation is normal  EYES:  Ophthalmoscopic exam of optic discs and posterior segments is normal; no papilledema or hemorrhages  MUSCULOSKELETAL:  Gait, strength, tone, movements noted in Neurologic exam below  NEUROLOGIC: MENTAL STATUS:  No flowsheet data found.  awake, alert, oriented to person, place and time  recent and remote memory intact  normal attention and concentration  language fluent, comprehension intact, naming intact,   fund of knowledge appropriate  CRANIAL NERVE:   2nd - no papilledema on fundoscopic exam  2nd, 3rd, 4th, 6th - pupils equal and reactive to light, visual fields full to confrontation, extraocular muscles intact, no nystagmus  5th - facial sensation symmetric  7th - facial strength symmetric  8th - hearing intact  9th - palate elevates symmetrically, uvula midline  11th - shoulder shrug symmetric  12th - tongue protrusion midline  MOTOR:   normal bulk and  tone, full strength in the BUE, BLE  SENSORY:   normal and symmetric to light touch, pinprick, temperature, vibration  COORDINATION:   finger-nose-finger, fine finger movements normal  REFLEXES:   deep tendon reflexes TRACE and symmetric  GAIT/STATION:   narrow based gait; ANTALGIC GAIT    DIAGNOSTIC DATA (LABS, IMAGING, TESTING) - I reviewed patient records, labs, notes, testing and imaging myself where available.  Lab Results  Component Value Date   WBC 5.7 09/14/2014   HGB 11.9* 09/14/2014   HCT 35.8* 09/14/2014   MCV 91.8 09/14/2014   PLT 172.0 09/14/2014      Component Value Date/Time   NA 138 09/14/2014 1511  K 3.8 09/14/2014 1511   CL 100 09/14/2014 1511   CO2 30 09/14/2014 1511   GLUCOSE 98 09/14/2014 1511   BUN 16 09/14/2014 1511   CREATININE 1.12 09/14/2014 1511   CALCIUM 9.6 09/14/2014 1511   GFRNONAA 65* 07/28/2013 1030   GFRAA 75* 07/28/2013 1030   No results found for: CHOL, HDL, LDLCALC, LDLDIRECT, TRIG, CHOLHDL No results found for: [id number] No results found for: VITAMINB12 No results found for: TSH   08/09/01 MRI brain  - NORMAL MRI OF THE BRAIN AND SKULL. PROBABLE REACTIVE LYMPH NODE IN THE SUBCUTANEOUS FATTY TISSUES POSTERIOR TO THE LEFT MASTOID PROCESS.  11/17/14 MRI lumbar spine (without) demonstrating: 1. At L4-5: disc bulging and facet hypertrophy with severe spinal stenosis, moderate right and severe left foraminal stenosis  2. At L3-4: disc bulging and facet and ligamentum flavum hypertrophy with moderate-severe spinal stenosis and severe biforaminal stenosis  3. At L5-S1: disc protrusion and facet hypertrophy with mild spinal stenosis, moderate right and severe left foraminal stenosis  4. At L2-3: disc bulging and facet hypertrophy with mild spinal stenosis and moderate foraminal stenosis  11/17/14 Mildly abnormal MRI brain (with and without) demonstrating: 1. Mild scattered periventricular and subcortical chronic small vessel  ischemic disease.  2. No acute findings.    ASSESSMENT AND PLAN  64 y.o. year old male here with for evaluation of right lateral thigh numbness, burning pain, most consistent with lumbar spinal stenosis and radiculopathies related to morbid obesity. Advised patient to continue focusing on weight management and weight reduction.   Regarding other symptoms including left face numbness, drooling, pain sensation --> MRI brain shows no acute findings. No specific etiology found. Could be mild intermittent trigeminal neuralgia, but not severe enough to warrant meds.   Dx:  Left facial pain  Numbness of right anterior thigh  Bilateral leg numbness  Spinal stenosis of lumbar region    PLAN: - reviewed options for lumbar spine such as conservative mgmt with PT, exercises vs spine surgery evaluation; patient wants to try conservative mgmt for now - follow up with PCP re: fatigue and mood issues  Return if symptoms worsen or fail to improve, for return to PCP.    Suanne MarkerVIKRAM R. Derryck Shahan, MD 11/26/2014, 3:25 PM Certified in Neurology, Neurophysiology and Neuroimaging  [facility] [address], [address] [place], [place] [address] [phone]

## 2014-11-26 NOTE — Patient Instructions (Signed)
-   monitor symptoms

## 2014-11-30 ENCOUNTER — Other Ambulatory Visit: Payer: BLUE CROSS/BLUE SHIELD

## 2014-12-02 ENCOUNTER — Telehealth: Payer: Self-pay | Admitting: Diagnostic Neuroimaging

## 2014-12-02 DIAGNOSIS — M48061 Spinal stenosis, lumbar region without neurogenic claudication: Secondary | ICD-10-CM

## 2014-12-02 DIAGNOSIS — R200 Anesthesia of skin: Secondary | ICD-10-CM

## 2014-12-02 NOTE — Telephone Encounter (Signed)
Yes. Refer for lumbar spinal stenosis. -VRP

## 2014-12-02 NOTE — Telephone Encounter (Signed)
Pt called and would like a referral [facility] [place]. Possible spine surgery

## 2014-12-03 NOTE — Telephone Encounter (Signed)
Referral ordered for patient.

## 2014-12-14 ENCOUNTER — Other Ambulatory Visit (HOSPITAL_COMMUNITY): Payer: Self-pay | Admitting: Neurological Surgery

## 2014-12-29 ENCOUNTER — Other Ambulatory Visit: Payer: Self-pay | Admitting: Cardiology

## 2014-12-30 ENCOUNTER — Other Ambulatory Visit (HOSPITAL_COMMUNITY): Payer: BLUE CROSS/BLUE SHIELD

## 2014-12-31 ENCOUNTER — Ambulatory Visit: Payer: BLUE CROSS/BLUE SHIELD | Admitting: Diagnostic Neuroimaging

## 2015-01-26 ENCOUNTER — Telehealth: Payer: Self-pay | Admitting: Internal Medicine

## 2015-01-26 NOTE — Telephone Encounter (Signed)
Paged by event monitor service that Mr. Printy was in rapid afib.  I called him on his cellular phone but he was not available.  I left a message for him to call his cardiologist or primary care physician.  Dossie ArbourEdward Fredna Stricker, MD

## 2015-04-14 ENCOUNTER — Ambulatory Visit (HOSPITAL_COMMUNITY)
Admission: RE | Admit: 2015-04-14 | Payer: BLUE CROSS/BLUE SHIELD | Source: Ambulatory Visit | Admitting: Neurological Surgery

## 2015-04-14 ENCOUNTER — Encounter (HOSPITAL_COMMUNITY): Admission: RE | Payer: Self-pay | Source: Ambulatory Visit

## 2015-04-14 SURGERY — LUMBAR LAMINECTOMY/DECOMPRESSION MICRODISCECTOMY 2 LEVELS
Anesthesia: General | Site: Back | Laterality: Bilateral

## 2015-06-17 ENCOUNTER — Ambulatory Visit
Admission: RE | Admit: 2015-06-17 | Discharge: 2015-06-17 | Disposition: A | Discharge status: Home / Self Care | Payer: 59 | Source: Ambulatory Visit | Attending: Internal Medicine | Admitting: Internal Medicine

## 2015-06-17 ENCOUNTER — Other Ambulatory Visit: Payer: Self-pay | Admitting: Internal Medicine

## 2015-06-17 DIAGNOSIS — R1084 Generalized abdominal pain: Secondary | ICD-10-CM

## 2015-06-17 MED ORDER — IOPAMIDOL (ISOVUE-300) INJECTION 61%
125.0000 mL | Freq: Once | INTRAVENOUS | Status: AC | PRN
  Administered 2015-06-17: 125 mL via INTRAVENOUS

## 2015-07-07 ENCOUNTER — Ambulatory Visit: Payer: Self-pay | Admitting: General Surgery

## 2015-07-07 NOTE — H&P (Signed)
Gary [name] 07/07/2015 11:14 AM Location: [facility] Patient #: [id number] DOB: [birth year] Single / Language: [place] / Race: White Male  History of Present Illness Gary Pollack [name]; 07/07/2015 2:15 PM) The patient is a 65 year old male.   Note:He is referred by Dr. Renne [name] for consultation regarding upper abdominal pain and cholelithiasis. Recently, he has developed epigastric abdominal pain that radiates to the right upper quadrant and toward the left upper quadrant especially after eating fatty foods. This has been associated with nausea and intermittent vomiting. Because of this, he is not eating as much. He presented to Dr. Norma [name] with this complaint. A CT scan was performed. I reviewed the images. It demonstrates a small hiatal hernia. Cholelithiasis is present. There is a hernia above the umbilicus containing fat. He is referred here to discuss cholecystectomy.  Of note is he has paroxysmal atrial fibrillation. He was placed on Eliquis by Dr. Donnie [name] but stopped it because it was too expensive. He has not told Dr. Ladona [name] this. He also has obstructive sleep apnea and it was recommended he be on CPAP but he elected not to go on CPAP. He lives alone. He does have family locally. He gets short of breath after climbing approximately 3 steps and walks with a cane. Dyspnea keeps him from walking more than 100 yards.  Problem List/Past Medical Gary [name]; 07/07/2015 12:09 PM) MORBID OBESITY WITH BMI OF 60.0-69.9, ADULT (E66.01)  Other Problems Gary [name]; 07/07/2015 12:09 PM) Arthritis Atrial Fibrillation Back Pain Chest pain Cholelithiasis Congestive Heart Failure Depression Gastroesophageal Reflux Disease High blood pressure Sleep Apnea Vascular Disease  Past Surgical History Gary [name], [name]; 07/07/2015 11:14 AM) Colon Polyp Removal - Colonoscopy Gastric Bypass Oral Surgery  Diagnostic Studies History Gary [name], [name]; 07/07/2015 11:14 AM) Colonoscopy 1-5 years ago  Allergies Gary [name], [name]; 07/07/2015 11:15 AM) No Known Drug Allergies 07/07/2015  Medication History Gary [name], [name]; 07/07/2015 11:18 AM) Lisinopril-Hydrochlorothiazide (10-12.5MG  Tablet, Oral) Active. Furosemide (  Tablet, Oral) Active. Meloxicam (  Tablet, Oral) Active. TraMADol HCl (  Tablet, Oral) Active. ROPINIRole HCl (0.5MG  Tablet, Oral) Active. Atorvastatin Calcium (  Tablet, Oral) Active. Ferrous Sulfate (220 (44 Fe)MG/5ML Elixir, Oral) Active. NexIUM (  Capsule DR, Oral) Active. Multi Vitamin/Minerals (Oral) Active. Multi Vitamin (Oral) Active. Medications Reconciled  Social History Gary [name]; 07/07/2015 11:14 AM) Alcohol use Occasional alcohol use. Caffeine use Carbonated beverages, Coffee, Tea. No drug use Tobacco use Former smoker.  Family History Gary [name]; 07/07/2015 11:14 AM) Arthritis Brother, Father, Mother, Sister. Cerebrovascular Accident Father, Mother. Colon Polyps Brother. Depression Mother. Diabetes Mellitus Father. Heart Disease Father, Mother. Heart disease in male family member before age 75 Heart disease in male family member before age 32 Hypertension Brother, Mother. Malignant Neoplasm Of Pancreas Mother. Seizure disorder Father, Sister.     Review of Systems Gary [name]; 07/07/2015 11:14 AM) General Present- Appetite Loss and Fatigue. Not Present- Chills, Fever, Night Sweats, Weight Gain and Weight Loss. Skin Present- Dryness. Not Present- Change in Wart/Mole, Hives, Jaundice, New Lesions, Non-Healing Wounds, Rash and Ulcer. HEENT Present- Wears glasses/contact lenses. Not Present- Earache, Hearing Loss, Hoarseness, Nose Bleed, Oral Ulcers, Ringing in the Ears, Seasonal Allergies, Sinus Pain, Sore Throat, Visual Disturbances and Yellow Eyes. Respiratory Present- Snoring. Not Present- Bloody sputum, Chronic Cough,  Difficulty Breathing and Wheezing. Breast Not Present- Breast Mass, Breast Pain, Nipple Discharge and Skin Changes. Cardiovascular Present- Chest Pain, Difficulty Breathing Lying Down, Shortness of Breath and Swelling of Extremities. Not  Present- Leg Cramps, Palpitations and Rapid Heart Rate. Gastrointestinal Present- Abdominal Pain, Bloating, Constipation, Difficulty Swallowing, Gets full quickly at meals, Indigestion, Nausea and Vomiting. Not Present- Bloody Stool, Change in Bowel Habits, Chronic diarrhea, Excessive gas, Hemorrhoids and Rectal Pain. Male Genitourinary Present- Frequency and Urgency. Not Present- Blood in Urine, Change in Urinary Stream, Impotence, Nocturia, Painful Urination and Urine Leakage. Musculoskeletal Present- Joint Pain, Joint Stiffness, Muscle Pain and Swelling of Extremities. Not Present- Back Pain and Muscle Weakness. Neurological Present- Numbness, Tingling, Trouble walking and Weakness. Not Present- Decreased Memory, Fainting, Headaches, Seizures and Tremor. Psychiatric Present- Depression. Not Present- Anxiety, Bipolar, Change in Sleep Pattern, Fearful and Frequent crying. Endocrine Not Present- Cold Intolerance, Excessive Hunger, Hair Changes, Heat Intolerance, Hot flashes and New Diabetes. Hematology Present- Easy Bruising. Not Present- Excessive bleeding, Gland problems, HIV and Persistent Infections.  Vitals Gary [name](Ashley Beck [name]; 07/07/2015 11:18 AM) 07/07/2015 11:18 AM Weight: 417 lb Height: 69in Body Surface Area: 2.82 m Body Mass Index: 61.58 kg/m  Temp.: 98.77F  Pulse: 84 (Regular)  BP: 138/86 (Sitting, Left Arm, Standard)      Physical Exam Gary [name]; 07/07/2015 2:17 PM)  The physical exam findings are as follows: Note:General: Morbidly obese male in NAD. Pleasant and cooperative.  HEENT: [place]/AT, no facial masses  EYES: EOMI, no icterus  CV: RRR  CHEST: Breath sounds equal and clear. Respirations nonlabored.  ABDOMEN:  Soft, massively obese, upper midline scar with some inferior fullness, mild tenderness in right upper quadrant and epigastric regions.  MUSCULOSKELETAL: 1 + edema, venous stasis changes present  SKIN: No jaundice.  NEUROLOGIC: Alert and oriented, answers questions appropriately.  PSYCHIATRIC: Normal mood, affect , and behavior.    Assessment & Plan Gary [name](Shenia Alan J. Lazaria Schaben [name]; 07/07/2015 2:21 PM)  SYMPTOMATIC CHOLELITHIASIS (K80.20) Impression: Symptoms exacerbated after eating fatty foods. He has a number of significant comorbidities which are not optimally treated due to lack of compliance. He is aware of this.  Plan: I will communicate with Dr. Donnie Ahoilley regarding cardiac risk and alert him that he is no longer taking his anticoagulation medication. If he is felt to be at acceptable risk and wants to proceed with the surgery, we can schedule laparoscopic possible open cholecystectomy. I would not address the incisional hernia at that time. I have explained the procedure, risks, and aftercare of cholecystectomy. Risks include but are not limited to bleeding, infection, wound problems, anesthesia, diarrhea, bile leak, injury to common bile duct/liver/intestine, prolonged mechanical ventilation, cardiac dysrhythmia, death. He seems to understand and agrees with the plan. Would keep him overnight after the surgery. He states he could stay with his brother postoperatively.  Avel Peaceodd Ahmiya Abee, [name]

## 2015-07-22 ENCOUNTER — Ambulatory Visit: Payer: Self-pay | Admitting: General Surgery

## 2015-07-26 ENCOUNTER — Other Ambulatory Visit: Payer: Self-pay | Admitting: General Surgery

## 2015-08-10 ENCOUNTER — Encounter (HOSPITAL_COMMUNITY): Payer: Self-pay

## 2015-08-12 ENCOUNTER — Encounter (HOSPITAL_COMMUNITY): Payer: Self-pay

## 2015-08-12 ENCOUNTER — Encounter (HOSPITAL_COMMUNITY)
Admission: RE | Admit: 2015-08-12 | Discharge: 2015-08-12 | Disposition: A | Discharge status: Home / Self Care | Payer: BLUE CROSS/BLUE SHIELD | Source: Ambulatory Visit | Attending: General Surgery | Admitting: General Surgery

## 2015-08-12 DIAGNOSIS — Z01812 Encounter for preprocedural laboratory examination: Secondary | ICD-10-CM | POA: Diagnosis not present

## 2015-08-12 DIAGNOSIS — K8020 Calculus of gallbladder without cholecystitis without obstruction: Secondary | ICD-10-CM | POA: Diagnosis not present

## 2015-08-12 HISTORY — DX: Personal history of other diseases of the digestive system: Z87.19

## 2015-08-12 HISTORY — DX: Cardiac arrhythmia, unspecified: I49.9

## 2015-08-12 LAB — COMPREHENSIVE METABOLIC PANEL
ALT: 16 U/L — ABNORMAL LOW (ref 17–63)
AST: 26 U/L (ref 15–41)
Albumin: 4.3 g/dL (ref 3.5–5.0)
Alkaline Phosphatase: 71 U/L (ref 38–126)
Anion gap: 8 (ref 5–15)
BUN: 29 mg/dL — AB (ref 6–20)
CHLORIDE: 104 mmol/L (ref 101–111)
CO2: 27 mmol/L (ref 22–32)
Calcium: 9.6 mg/dL (ref 8.9–10.3)
Creatinine, Ser: 1.72 mg/dL — ABNORMAL HIGH (ref 0.61–1.24)
GFR calc Af Amer: 47 mL/min — ABNORMAL LOW (ref >60)
GFR, EST NON AFRICAN AMERICAN: 41 mL/min — AB (ref >60)
Glucose, Bld: 101 mg/dL — ABNORMAL HIGH (ref 65–99)
POTASSIUM: 4.7 mmol/L (ref 3.5–5.1)
Sodium: 139 mmol/L (ref 135–145)
Total Bilirubin: 0.7 mg/dL (ref 0.3–1.2)
Total Protein: 7.8 g/dL (ref 6.5–8.1)

## 2015-08-12 LAB — PROTIME-INR
INR: 1.07
Prothrombin Time: 14 seconds (ref 11.4–15.2)

## 2015-08-12 LAB — CBC WITH DIFFERENTIAL/PLATELET
BASOS ABS: 0.1 10*3/uL (ref 0.0–0.1)
BASOS PCT: 1 %
EOS ABS: 0.4 10*3/uL (ref 0.0–0.7)
EOS PCT: 6 %
HCT: 32.1 % — ABNORMAL LOW (ref 39.0–52.0)
Hemoglobin: 10.2 g/dL — ABNORMAL LOW (ref 13.0–17.0)
LYMPHS PCT: 13 %
Lymphs Abs: 0.9 10*3/uL (ref 0.7–4.0)
MCH: 30.6 pg (ref 26.0–34.0)
MCHC: 31.8 g/dL (ref 30.0–36.0)
MCV: 96.4 fL (ref 78.0–100.0)
MONO ABS: 0.5 10*3/uL (ref 0.1–1.0)
Monocytes Relative: 8 %
Neutro Abs: 4.6 10*3/uL (ref 1.7–7.7)
Neutrophils Relative %: 72 %
PLATELETS: 212 10*3/uL (ref 150–400)
RBC: 3.33 MIL/uL — ABNORMAL LOW (ref 4.22–5.81)
RDW: 15.3 % (ref 11.5–15.5)
WBC: 6.3 10*3/uL (ref 4.0–10.5)

## 2015-08-12 NOTE — Progress Notes (Signed)
Spoke with Amy in [employer] to request a Bari-bed for patient on day of surgery on Wednesday 08/18/2015 at 0630 to arrive in Short Stay for surgery at 0830am!

## 2015-08-12 NOTE — Patient Instructions (Signed)
Gary [name]  08/12/2015   Your procedure is scheduled on: Wednesday 08/18/2015  Report to [facility] Main  Entrance take [place]  elevators to 3rd floor to  Short Stay Center at  0630  AM.  Call this number if you have problems the morning of surgery [phone]   Remember: ONLY 1 PERSON MAY GO WITH YOU TO SHORT STAY TO GET  READY MORNING OF YOUR SURGERY.   Do not eat food or drink liquids :After Midnight.     Take these medicines the morning of surgery with A SIP OF WATER: Bupropion (Wellbutrin), Omeprazole                                 You may not have any metal on your body including hair pins and              piercings  Do not wear jewelry, make-up, lotions, powders or perfumes, deodorant             Do not wear nail polish.  Do not shave  48 hours prior to surgery.              Men may shave face and neck.   Do not bring valuables to the hospital. [name] IS NOT             RESPONSIBLE   FOR VALUABLES.  Contacts, dentures or bridgework may not be worn into surgery.  Leave suitcase in the car. After surgery it may be brought to your room.                  Please read over the following fact sheets you were given: _____________________________________________________________________             [facility] - Preparing for Surgery Before surgery, you can play an important role.  Because skin is not sterile, your skin needs to be as free of germs as possible.  You can reduce the number of germs on your skin by washing with CHG (chlorahexidine gluconate) soap before surgery.  CHG is an antiseptic cleaner which kills germs and bonds with the skin to continue killing germs even after washing. Please DO NOT use if you have an allergy to CHG or antibacterial soaps.  If your skin becomes reddened/irritated stop using the CHG and inform your nurse when you arrive at Short Stay. Do not shave (including legs and underarms) for at least 48 hours prior to  the first CHG shower.  You may shave your face/neck. Please follow these instructions carefully:  1.  Shower with CHG Soap the night before surgery and the  morning of Surgery.  2.  If you choose to wash your hair, wash your hair first as usual with your  normal  shampoo.  3.  After you shampoo, rinse your hair and body thoroughly to remove the  shampoo.                           4.  Use CHG as you would any other liquid soap.  You can apply chg directly  to the skin and wash                       Gently with a scrungie or clean washcloth.  5.  Apply the CHG Soap to your body ONLY FROM THE NECK DOWN.   Do not use on face/ open                           Wound or open sores. Avoid contact with eyes, ears mouth and genitals (private parts).                       Wash face,  Genitals (private parts) with your normal soap.             6.  Wash thoroughly, paying special attention to the area where your surgery  will be performed.  7.  Thoroughly rinse your body with warm water from the neck down.  8.  DO NOT shower/wash with your normal soap after using and rinsing off  the CHG Soap.                9.  Pat yourself dry with a clean towel.            10.  Wear clean pajamas.            11.  Place clean sheets on your bed the night of your first shower and do not  sleep with pets. Day of Surgery : Do not apply any lotions/deodorants the morning of surgery.  Please wear clean clothes to the hospital/surgery center.  FAILURE TO FOLLOW THESE INSTRUCTIONS MAY RESULT IN THE CANCELLATION OF YOUR SURGERY PATIENT SIGNATURE_________________________________  NURSE SIGNATURE__________________________________  ________________________________________________________________________   Gary [name]  An incentive spirometer is a tool that can help keep your lungs clear and active. This tool measures how well you are filling your lungs with each breath. Taking long deep breaths may help reverse or  decrease the chance of developing breathing (pulmonary) problems (especially infection) following:  A long period of time when you are unable to move or be active. BEFORE THE PROCEDURE   If the spirometer includes an indicator to show your best effort, your nurse or respiratory therapist will set it to a desired goal.  If possible, sit up straight or lean slightly forward. Try not to slouch.  Hold the incentive spirometer in an upright position. INSTRUCTIONS FOR USE  1. Sit on the edge of your bed if possible, or sit up as far as you can in bed or on a chair. 2. Hold the incentive spirometer in an upright position. 3. Breathe out normally. 4. Place the mouthpiece in your mouth and seal your lips tightly around it. 5. Breathe in slowly and as deeply as possible, raising the piston or the ball toward the top of the column. 6. Hold your breath for 3-5 seconds or for as long as possible. Allow the piston or ball to fall to the bottom of the column. 7. Remove the mouthpiece from your mouth and breathe out normally. 8. Rest for a few seconds and repeat Steps 1 through 7 at least 10 times every 1-2 hours when you are awake. Take your time and take a few normal breaths between deep breaths. 9. The spirometer may include an indicator to show your best effort. Use the indicator as a goal to work toward during each repetition. 10. After each set of 10 deep breaths, practice coughing to be sure your lungs are clear. If you have an incision (the cut made at the time of surgery), support your incision when coughing by placing a  pillow or rolled up towels firmly against it. Once you are able to get out of bed, walk around indoors and cough well. You may stop using the incentive spirometer when instructed by your caregiver.  RISKS AND COMPLICATIONS  Take your time so you do not get dizzy or light-headed.  If you are in pain, you may need to take or ask for pain medication before doing incentive spirometry.  It is harder to take a deep breath if you are having pain. AFTER USE  Rest and breathe slowly and easily.  It can be helpful to keep track of a log of your progress. Your caregiver can provide you with a simple table to help with this. If you are using the spirometer at home, follow these instructions: SEEK MEDICAL CARE IF:   You are having difficultly using the spirometer.  You have trouble using the spirometer as often as instructed.  Your pain medication is not giving enough relief while using the spirometer.  You develop fever of 100.5 F (38.1 C) or higher. SEEK IMMEDIATE MEDICAL CARE IF:   You cough up bloody sputum that had not been present before.  You develop fever of 102 F (38.9 C) or greater.  You develop worsening pain at or near the incision site. MAKE SURE YOU:   Understand these instructions.  Will watch your condition.  Will get help right away if you are not doing well or get worse. Document Released: 05/15/2006 Document Revised: 03/27/2011 Document Reviewed: 07/16/2006 [facility] Patient Information 2014 [place], [place].   ________________________________________________________________________

## 2015-08-17 MED ORDER — DEXTROSE 5 % IV SOLN
3.0000 g | INTRAVENOUS | Status: AC
  Administered 2015-08-18: 3 g via INTRAVENOUS
  Filled 2015-08-17: qty 3

## 2015-08-17 NOTE — Anesthesia Preprocedure Evaluation (Addendum)
Anesthesia Evaluation  Patient identified by MRN, date of birth, ID band Patient awake    Reviewed: Allergy & Precautions, H&P , NPO status , Patient's Chart, lab work & pertinent test results  Airway Mallampati: II  TM Distance: >3 FB Neck ROM: full    Dental no notable dental hx. (+) Dental Advisory Given, Teeth Intact   Pulmonary neg pulmonary ROS, shortness of breath and with exertion, sleep apnea , former smoker,    Pulmonary exam normal breath sounds clear to auscultation       Cardiovascular Exercise Tolerance: Good hypertension, Pt. on medications + CAD  negative cardio ROS Normal cardiovascular exam+ dysrhythmias Atrial Fibrillation  Rhythm:regular Rate:Normal  LAFB. 3 vessels calcified on CT scan 2016. Diastolic dysfunction.    Neuro/Psych Anxiety Depression Numbness both legs negative neurological ROS  negative psych ROS   GI/Hepatic negative GI ROS, Neg liver ROS,   Endo/Other  negative endocrine ROSMorbid obesitySuper morbid obesity  Renal/GU negative Renal ROS  negative genitourinary   Musculoskeletal   Abdominal (+) + obese,   Peds  Hematology negative hematology ROS (+) anemia , Hgb 10.2   Anesthesia Other Findings   Reproductive/Obstetrics negative OB ROS                            Anesthesia Physical Anesthesia Plan  ASA: IV  Anesthesia Plan: General   Post-op Pain Management:    Induction: Intravenous  Airway Management Planned: Oral ETT  Additional Equipment:   Intra-op Plan:   Post-operative Plan: Extubation in OR  Informed Consent: I have reviewed the patients History and Physical, chart, labs and discussed the procedure including the risks, benefits and alternatives for the proposed anesthesia with the patient or authorized representative who has indicated his/her understanding and acceptance.   Dental Advisory Given  Plan Discussed with:  CRNA  Anesthesia Plan Comments:         Anesthesia Quick Evaluation

## 2015-08-18 ENCOUNTER — Ambulatory Visit (HOSPITAL_COMMUNITY): Payer: BLUE CROSS/BLUE SHIELD | Admitting: Anesthesiology

## 2015-08-18 ENCOUNTER — Encounter (HOSPITAL_COMMUNITY): Payer: Self-pay | Admitting: *Deleted

## 2015-08-18 ENCOUNTER — Encounter (HOSPITAL_COMMUNITY)
Admission: RE | Disposition: A | Discharge status: Home / Self Care | Payer: Self-pay | Source: Ambulatory Visit | Attending: General Surgery

## 2015-08-18 ENCOUNTER — Observation Stay (HOSPITAL_COMMUNITY)
Admission: RE | Admit: 2015-08-18 | Discharge: 2015-08-19 | Disposition: A | Discharge status: Home / Self Care | Payer: BLUE CROSS/BLUE SHIELD | Source: Ambulatory Visit | Attending: General Surgery | Admitting: General Surgery

## 2015-08-18 ENCOUNTER — Ambulatory Visit (HOSPITAL_COMMUNITY): Payer: BLUE CROSS/BLUE SHIELD

## 2015-08-18 DIAGNOSIS — Z87891 Personal history of nicotine dependence: Secondary | ICD-10-CM | POA: Diagnosis not present

## 2015-08-18 DIAGNOSIS — K219 Gastro-esophageal reflux disease without esophagitis: Secondary | ICD-10-CM | POA: Diagnosis not present

## 2015-08-18 DIAGNOSIS — I110 Hypertensive heart disease with heart failure: Secondary | ICD-10-CM | POA: Diagnosis not present

## 2015-08-18 DIAGNOSIS — K8020 Calculus of gallbladder without cholecystitis without obstruction: Secondary | ICD-10-CM | POA: Diagnosis present

## 2015-08-18 DIAGNOSIS — I509 Heart failure, unspecified: Secondary | ICD-10-CM | POA: Diagnosis not present

## 2015-08-18 DIAGNOSIS — I480 Paroxysmal atrial fibrillation: Secondary | ICD-10-CM | POA: Insufficient documentation

## 2015-08-18 DIAGNOSIS — M1990 Unspecified osteoarthritis, unspecified site: Secondary | ICD-10-CM | POA: Insufficient documentation

## 2015-08-18 DIAGNOSIS — Z7982 Long term (current) use of aspirin: Secondary | ICD-10-CM | POA: Insufficient documentation

## 2015-08-18 DIAGNOSIS — Z79899 Other long term (current) drug therapy: Secondary | ICD-10-CM | POA: Insufficient documentation

## 2015-08-18 DIAGNOSIS — F329 Major depressive disorder, single episode, unspecified: Secondary | ICD-10-CM | POA: Diagnosis not present

## 2015-08-18 DIAGNOSIS — K8010 Calculus of gallbladder with chronic cholecystitis without obstruction: Secondary | ICD-10-CM | POA: Diagnosis not present

## 2015-08-18 DIAGNOSIS — G4733 Obstructive sleep apnea (adult) (pediatric): Secondary | ICD-10-CM | POA: Diagnosis not present

## 2015-08-18 DIAGNOSIS — K819 Cholecystitis, unspecified: Secondary | ICD-10-CM

## 2015-08-18 DIAGNOSIS — I2510 Atherosclerotic heart disease of native coronary artery without angina pectoris: Secondary | ICD-10-CM | POA: Insufficient documentation

## 2015-08-18 DIAGNOSIS — R1010 Upper abdominal pain, unspecified: Secondary | ICD-10-CM | POA: Diagnosis present

## 2015-08-18 DIAGNOSIS — Z6841 Body Mass Index (BMI) 40.0 and over, adult: Secondary | ICD-10-CM | POA: Diagnosis not present

## 2015-08-18 HISTORY — PX: CHOLECYSTECTOMY: SHX55

## 2015-08-18 SURGERY — LAPAROSCOPIC CHOLECYSTECTOMY WITH INTRAOPERATIVE CHOLANGIOGRAM
Anesthesia: General | Site: Abdomen

## 2015-08-18 MED ORDER — ONDANSETRON HCL 4 MG/2ML IJ SOLN
INTRAMUSCULAR | Status: DC | PRN
  Administered 2015-08-18: 4 mg via INTRAVENOUS

## 2015-08-18 MED ORDER — BUPIVACAINE HCL (PF) 0.5 % IJ SOLN
INTRAMUSCULAR | Status: AC
  Filled 2015-08-18: qty 30

## 2015-08-18 MED ORDER — CHLORHEXIDINE GLUCONATE CLOTH 2 % EX PADS: 6.0000 | MEDICATED_PAD | Freq: Once | CUTANEOUS | Status: DC

## 2015-08-18 MED ORDER — HYDROCHLOROTHIAZIDE 25 MG PO TABS
25.0000 mg | ORAL_TABLET | Freq: Every day | ORAL | Status: DC
  Administered 2015-08-18 – 2015-08-19 (×2): 25 mg via ORAL
  Filled 2015-08-18 (×2): qty 1

## 2015-08-18 MED ORDER — KCL IN DEXTROSE-NACL 20-5-0.45 MEQ/L-%-% IV SOLN
INTRAVENOUS | Status: DC
  Administered 2015-08-18 – 2015-08-19 (×2): via INTRAVENOUS
  Filled 2015-08-18 (×2): qty 1000

## 2015-08-18 MED ORDER — LISINOPRIL 20 MG PO TABS
20.0000 mg | ORAL_TABLET | Freq: Every day | ORAL | Status: DC
  Administered 2015-08-18 – 2015-08-19 (×2): 20 mg via ORAL
  Filled 2015-08-18 (×2): qty 1

## 2015-08-18 MED ORDER — SUGAMMADEX SODIUM 500 MG/5ML IV SOLN
INTRAVENOUS | Status: AC
  Filled 2015-08-18: qty 5

## 2015-08-18 MED ORDER — LISINOPRIL-HYDROCHLOROTHIAZIDE 20-25 MG PO TABS: 1.0000 | ORAL_TABLET | Freq: Every day | ORAL | Status: DC

## 2015-08-18 MED ORDER — FUROSEMIDE 40 MG PO TABS
40.0000 mg | ORAL_TABLET | Freq: Every day | ORAL | Status: DC
  Administered 2015-08-18 – 2015-08-19 (×2): 40 mg via ORAL
  Filled 2015-08-18 (×2): qty 1

## 2015-08-18 MED ORDER — LIDOCAINE HCL (CARDIAC) 20 MG/ML IV SOLN
INTRAVENOUS | Status: AC
  Filled 2015-08-18: qty 5

## 2015-08-18 MED ORDER — LACTATED RINGERS IV SOLN: INTRAVENOUS | Status: DC

## 2015-08-18 MED ORDER — LIDOCAINE HCL (CARDIAC) 20 MG/ML IV SOLN
INTRAVENOUS | Status: DC | PRN
  Administered 2015-08-18: 100 mg via INTRAVENOUS

## 2015-08-18 MED ORDER — SUGAMMADEX SODIUM 200 MG/2ML IV SOLN
INTRAVENOUS | Status: DC | PRN
  Administered 2015-08-18: 400 mg via INTRAVENOUS

## 2015-08-18 MED ORDER — ONDANSETRON HCL 4 MG/2ML IJ SOLN: 4.0000 mg | INTRAMUSCULAR | Status: DC | PRN

## 2015-08-18 MED ORDER — MIDAZOLAM HCL 2 MG/2ML IJ SOLN
INTRAMUSCULAR | Status: AC
  Filled 2015-08-18: qty 2

## 2015-08-18 MED ORDER — HEPARIN SODIUM (PORCINE) 5000 UNIT/ML IJ SOLN
5000.0000 [IU] | Freq: Three times a day (TID) | INTRAMUSCULAR | Status: DC
  Administered 2015-08-19: 5000 [IU] via SUBCUTANEOUS
  Filled 2015-08-18: qty 1

## 2015-08-18 MED ORDER — MIDAZOLAM HCL 5 MG/5ML IJ SOLN
INTRAMUSCULAR | Status: DC | PRN
  Administered 2015-08-18 (×2): 1 mg via INTRAVENOUS

## 2015-08-18 MED ORDER — ROPINIROLE HCL 0.5 MG PO TABS
0.5000 mg | ORAL_TABLET | Freq: Three times a day (TID) | ORAL | Status: DC | PRN
  Filled 2015-08-18: qty 1

## 2015-08-18 MED ORDER — ATROPINE SULFATE 0.4 MG/ML IJ SOLN
INTRAMUSCULAR | Status: AC
  Filled 2015-08-18: qty 2

## 2015-08-18 MED ORDER — HYDROMORPHONE HCL 1 MG/ML IJ SOLN
INTRAMUSCULAR | Status: AC
  Filled 2015-08-18: qty 1

## 2015-08-18 MED ORDER — MORPHINE SULFATE (PF) 2 MG/ML IV SOLN
2.0000 mg | INTRAVENOUS | Status: DC | PRN
  Administered 2015-08-18: 4 mg via INTRAVENOUS
  Filled 2015-08-18: qty 2
  Filled 2015-08-18: qty 1

## 2015-08-18 MED ORDER — LACTATED RINGERS IR SOLN
Status: DC | PRN
  Administered 2015-08-18: 1000 mL

## 2015-08-18 MED ORDER — SUCCINYLCHOLINE CHLORIDE 20 MG/ML IJ SOLN
INTRAMUSCULAR | Status: DC | PRN
  Administered 2015-08-18: 120 mg via INTRAVENOUS

## 2015-08-18 MED ORDER — ONDANSETRON HCL 4 MG/2ML IJ SOLN
INTRAMUSCULAR | Status: AC
  Filled 2015-08-18: qty 2

## 2015-08-18 MED ORDER — TRAMADOL HCL 50 MG PO TABS: 50.0000 mg | ORAL_TABLET | Freq: Four times a day (QID) | ORAL | Status: DC | PRN

## 2015-08-18 MED ORDER — BUPROPION HCL ER (XL) 300 MG PO TB24
300.0000 mg | ORAL_TABLET | Freq: Every day | ORAL | Status: DC
  Administered 2015-08-19: 300 mg via ORAL
  Filled 2015-08-18: qty 1

## 2015-08-18 MED ORDER — FENTANYL CITRATE (PF) 100 MCG/2ML IJ SOLN
INTRAMUSCULAR | Status: DC | PRN
  Administered 2015-08-18: 25 ug via INTRAVENOUS
  Administered 2015-08-18: 50 ug via INTRAVENOUS
  Administered 2015-08-18 (×3): 25 ug via INTRAVENOUS
  Administered 2015-08-18 (×2): 50 ug via INTRAVENOUS

## 2015-08-18 MED ORDER — CETYLPYRIDINIUM CHLORIDE 0.05 % MT LIQD
7.0000 mL | Freq: Two times a day (BID) | OROMUCOSAL | Status: DC
  Administered 2015-08-18: 7 mL via OROMUCOSAL

## 2015-08-18 MED ORDER — ONDANSETRON 4 MG PO TBDP: 4.0000 mg | ORAL_TABLET | Freq: Four times a day (QID) | ORAL | Status: DC | PRN

## 2015-08-18 MED ORDER — ROCURONIUM BROMIDE 100 MG/10ML IV SOLN
INTRAVENOUS | Status: DC | PRN
  Administered 2015-08-18: 50 mg via INTRAVENOUS
  Administered 2015-08-18 (×2): 10 mg via INTRAVENOUS

## 2015-08-18 MED ORDER — IOPAMIDOL (ISOVUE-300) INJECTION 61%
INTRAVENOUS | Status: AC
  Filled 2015-08-18: qty 50

## 2015-08-18 MED ORDER — PROMETHAZINE HCL 25 MG/ML IJ SOLN
INTRAMUSCULAR | Status: AC
  Filled 2015-08-18: qty 1

## 2015-08-18 MED ORDER — DEXAMETHASONE SODIUM PHOSPHATE 10 MG/ML IJ SOLN
INTRAMUSCULAR | Status: DC | PRN
  Administered 2015-08-18: 10 mg via INTRAVENOUS

## 2015-08-18 MED ORDER — FENTANYL CITRATE (PF) 250 MCG/5ML IJ SOLN
INTRAMUSCULAR | Status: AC
  Filled 2015-08-18: qty 5

## 2015-08-18 MED ORDER — PROMETHAZINE HCL 25 MG/ML IJ SOLN
6.2500 mg | INTRAMUSCULAR | Status: DC | PRN
  Administered 2015-08-18: 6.25 mg via INTRAVENOUS

## 2015-08-18 MED ORDER — SODIUM CHLORIDE 0.9 % IJ SOLN
INTRAMUSCULAR | Status: AC
  Filled 2015-08-18: qty 10

## 2015-08-18 MED ORDER — LACTATED RINGERS IV SOLN
INTRAVENOUS | Status: DC | PRN
  Administered 2015-08-18 (×2): via INTRAVENOUS

## 2015-08-18 MED ORDER — BUPIVACAINE HCL 0.5 % IJ SOLN
INTRAMUSCULAR | Status: DC | PRN
  Administered 2015-08-18: 14 mL

## 2015-08-18 MED ORDER — OXYCODONE HCL 5 MG PO TABS
5.0000 mg | ORAL_TABLET | ORAL | Status: DC | PRN
  Administered 2015-08-18 – 2015-08-19 (×3): 10 mg via ORAL
  Filled 2015-08-18 (×3): qty 2

## 2015-08-18 MED ORDER — ROCURONIUM BROMIDE 100 MG/10ML IV SOLN
INTRAVENOUS | Status: AC
  Filled 2015-08-18: qty 1

## 2015-08-18 MED ORDER — EPHEDRINE SULFATE 50 MG/ML IJ SOLN
INTRAMUSCULAR | Status: AC
  Filled 2015-08-18: qty 1

## 2015-08-18 MED ORDER — HYDROMORPHONE HCL 1 MG/ML IJ SOLN
0.2500 mg | INTRAMUSCULAR | Status: DC | PRN
  Administered 2015-08-18 (×4): 0.5 mg via INTRAVENOUS

## 2015-08-18 MED ORDER — PROPOFOL 10 MG/ML IV BOLUS
INTRAVENOUS | Status: DC | PRN
  Administered 2015-08-18: 250 mg via INTRAVENOUS
  Administered 2015-08-18: 50 mg via INTRAVENOUS

## 2015-08-18 MED ORDER — PROPOFOL 10 MG/ML IV BOLUS
INTRAVENOUS | Status: AC
  Filled 2015-08-18: qty 40

## 2015-08-18 MED ORDER — DEXAMETHASONE SODIUM PHOSPHATE 10 MG/ML IJ SOLN
INTRAMUSCULAR | Status: AC
  Filled 2015-08-18: qty 1

## 2015-08-18 MED ORDER — 0.9 % SODIUM CHLORIDE (POUR BTL) OPTIME
TOPICAL | Status: DC | PRN
  Administered 2015-08-18: 1000 mL

## 2015-08-18 MED ORDER — CEFAZOLIN SODIUM-DEXTROSE 2-4 GM/100ML-% IV SOLN
2.0000 g | Freq: Three times a day (TID) | INTRAVENOUS | Status: AC
  Administered 2015-08-18: 2 g via INTRAVENOUS
  Filled 2015-08-18: qty 100

## 2015-08-18 MED ORDER — PANTOPRAZOLE SODIUM 40 MG PO TBEC
40.0000 mg | DELAYED_RELEASE_TABLET | Freq: Every day | ORAL | Status: DC
  Administered 2015-08-19: 40 mg via ORAL
  Filled 2015-08-18: qty 1

## 2015-08-18 SURGICAL SUPPLY — 46 items
APPLIER CLIP 5 13 M/L LIGAMAX5 (MISCELLANEOUS) ×2
BENZOIN TINCTURE PRP APPL 2/3 (GAUZE/BANDAGES/DRESSINGS) ×2 IMPLANT
CABLE HIGH FREQUENCY MONO STRZ (ELECTRODE) ×2 IMPLANT
CATH REDDICK CHOLANGI 4FR 50CM (CATHETERS) ×2 IMPLANT
CHLORAPREP W/TINT 26ML (MISCELLANEOUS) ×2 IMPLANT
CLIP APPLIE 5 13 M/L LIGAMAX5 (MISCELLANEOUS) ×1 IMPLANT
COVER MAYO STAND STRL (DRAPES) ×2 IMPLANT
COVER SURGICAL LIGHT HANDLE (MISCELLANEOUS) ×2 IMPLANT
DECANTER SPIKE VIAL GLASS SM (MISCELLANEOUS) IMPLANT
DISSECTOR BLUNT TIP ENDO 5MM (MISCELLANEOUS) ×2 IMPLANT
DRAIN CHANNEL 19F RND (DRAIN) ×2 IMPLANT
DRAPE C-ARM 42X120 X-RAY (DRAPES) ×2 IMPLANT
DRAPE LAPAROSCOPIC ABDOMINAL (DRAPES) ×2 IMPLANT
DRSG TEGADERM 2-3/8X2-3/4 SM (GAUZE/BANDAGES/DRESSINGS) ×2 IMPLANT
ELECT REM PT RETURN 9FT ADLT (ELECTROSURGICAL) ×2
ELECTRODE REM PT RTRN 9FT ADLT (ELECTROSURGICAL) ×1 IMPLANT
ENDOLOOP SUT PDS II  0 18 (SUTURE)
ENDOLOOP SUT PDS II 0 18 (SUTURE) IMPLANT
EVACUATOR SILICONE 100CC (DRAIN) ×2 IMPLANT
GAUZE SPONGE 2X2 8PLY STRL LF (GAUZE/BANDAGES/DRESSINGS) ×1 IMPLANT
GLOVE ECLIPSE 8.0 STRL XLNG CF (GLOVE) ×2 IMPLANT
GLOVE INDICATOR 8.0 STRL GRN (GLOVE) ×2 IMPLANT
GOWN STRL REUS W/TWL XL LVL3 (GOWN DISPOSABLE) ×6 IMPLANT
HEMOSTAT SNOW SURGICEL 2X4 (HEMOSTASIS) ×2 IMPLANT
IRRIG SUCT STRYKERFLOW 2 WTIP (MISCELLANEOUS) ×2
IRRIGATION SUCT STRKRFLW 2 WTP (MISCELLANEOUS) ×1 IMPLANT
IV CATH 14GX2 1/4 (CATHETERS) ×2 IMPLANT
KIT BASIN OR (CUSTOM PROCEDURE TRAY) ×2 IMPLANT
POUCH RETRIEVAL ECOSAC 10 (ENDOMECHANICALS) ×1 IMPLANT
POUCH RETRIEVAL ECOSAC 10MM (ENDOMECHANICALS) ×1
POUCH SPECIMEN RETRIEVAL 10MM (ENDOMECHANICALS) ×2 IMPLANT
SCISSORS LAP 5X35 DISP (ENDOMECHANICALS) ×2 IMPLANT
SLEEVE XCEL OPT CAN 5 100 (ENDOMECHANICALS) ×4 IMPLANT
SPONGE DRAIN TRACH 4X4 STRL 2S (GAUZE/BANDAGES/DRESSINGS) ×2 IMPLANT
SPONGE GAUZE 2X2 STER 10/PKG (GAUZE/BANDAGES/DRESSINGS) ×1
STRIP CLOSURE SKIN 1/2X4 (GAUZE/BANDAGES/DRESSINGS) ×2 IMPLANT
SUT ETHILON 3 0 PS 1 (SUTURE) ×2 IMPLANT
SUT MNCRL AB 4-0 PS2 18 (SUTURE) ×2 IMPLANT
TOWEL OR 17X26 10 PK STRL BLUE (TOWEL DISPOSABLE) ×2 IMPLANT
TOWEL OR NON WOVEN STRL DISP B (DISPOSABLE) IMPLANT
TRAY LAPAROSCOPIC (CUSTOM PROCEDURE TRAY) ×2 IMPLANT
TROCAR BLADELESS OPT 5 100 (ENDOMECHANICALS) ×2 IMPLANT
TROCAR BLADELESS OPT 5 150 (ENDOMECHANICALS) ×6 IMPLANT
TROCAR XCEL BLUNT TIP 100MML (ENDOMECHANICALS) ×2 IMPLANT
TROCAR XCEL NON-BLD 11X100MML (ENDOMECHANICALS) ×2 IMPLANT
TUBING INSUF HEATED (TUBING) ×2 IMPLANT

## 2015-08-18 NOTE — Discharge Instructions (Addendum)
LAPAROSCOPIC SURGERY: POST OP INSTRUCTIONS  1. DIET: Follow a light bland diet the first 24 hours after arrival home, such as soup, liquids, crackers, etc.  Be sure to include lots of fluids daily.  Avoid fast food or heavy meals as your are more likely to get nauseated.  Eat a low fat the next few days after surgery.   2. Take your usually prescribed home medications unless otherwise directed. 3. PAIN CONTROL: a. Pain is best controlled by a usual combination of three different methods TOGETHER: i. Ice/Heat ii. Over the counter pain medication iii. Prescription pain medication b. Most patients will experience some swelling and bruising around the incisions.  Ice packs or heating pads (30-60 minutes up to 6 times a day) will help. Use ice for the first few days to help decrease swelling and bruising, then switch to heat to help relax tight/sore spots and speed recovery.  Some people prefer to use ice alone, heat alone, alternating between ice & heat.  Experiment to what works for you.  Swelling and bruising can take several weeks to resolve.   c. It is helpful to take an over-the-counter pain medication regularly for the first few weeks.  Choose one of the following that works best for you: i. Naproxen (Aleve, etc)  Two  tabs twice a day ii. Ibuprofen (Advil, etc) Three  tabs four times a day (every meal & bedtime) iii. Acetaminophen (Tylenol, etc) 500-650mg  four times a day (every meal & bedtime) d. A  prescription for pain medication (such as oxycodone, hydrocodone, etc) should be given to you upon discharge.  Take your pain medication as prescribed.  i. If you are having problems/concerns with the prescription medicine (does not control pain, nausea, vomiting, rash, itching, etc), please call us [phone] to see if we need to switch you to a different pain medicine that will work better for you and/or control your side effect better. ii. If you need a refill on your pain medication,  please contact your pharmacy.  They will contact our office to request authorization. Prescriptions will not be filled after 5 pm or on week-ends. 4. Avoid getting constipated.  Between the surgery and the pain medications, it is common to experience some constipation.  Increasing fluid intake and taking a fiber supplement (such as Metamucil, Citrucel, FiberCon, MiraLax, etc) 1-2 times a day regularly will usually help prevent this problem from occurring.  A mild laxative (prune juice, Milk of Magnesia, MiraLax, etc) should be taken according to package directions if there are no bowel movements after 48 hours.   5. Watch out for diarrhea.  If you have many loose bowel movements, simplify your diet to bland foods & liquids for a few days.  Stop any stool softeners and decrease your fiber supplement.  Switching to mild anti-diarrheal medications (Kayopectate, Pepto Bismol) can help.  If this worsens or does not improve, please call us. 6. Wash / shower every day.  You may wash around the dressings as they are waterproof.   7. Remove your waterproof bandages 5 days after surgery.  You may leave the incision open to air.  You may replace a dressing/Band-Aid to cover the incision for comfort if you wish.  8. Empty drain and record output twice a day. 9. RESTART ASPIRIN 08/21/15 10. ACTIVITIES as tolerated:   a. You may resume regular (light) daily activities beginning the next day--such as daily self-care, walking, climbing stairs--gradually increasing light activities as tolerated.  No heavy lifting (over  10 pounds), straining, or intense activities for 2 weeks. b. DO NOT PUSH THROUGH PAIN.  Let pain be your guide: If it hurts to do something, don't do it.  Pain is your body warning you to avoid that activity for another week until the pain goes down. c. You may drive when you are no longer taking prescription pain medication, you can comfortably wear a seatbelt, and you can safely maneuver your car and apply  brakes. d. Gary [name] may have sexual intercourse when it is comfortable.  11. FOLLOW UP in our office a. Please call [facility] at [phone] to set up an appointment to see your surgeon in the office for a follow-up appointment approximately one week after your surgery. b. Make sure that you call for this appointment the day you arrive home to insure a convenient appointment time. 10. IF YOU HAVE DISABILITY OR FAMILY LEAVE FORMS, BRING THEM TO THE OFFICE FOR PROCESSING.  DO NOT GIVE THEM TO YOUR DOCTOR.  11.  Return to work/school:  Desk work/light activities in 5-7 days, full duty/activities in 2 weeks if pain-free.   WHEN TO CALL us [phone]: 1. Poor pain control 2. Reactions / problems with new medications (rash/itching, nausea, etc)  3. Fever over 101.5 F (38.5 C) 4. Inability to urinate 5. Nausea and/or vomiting 6. Worsening swelling or bruising 7. Continued bleeding from incision. 8. Increased pain, redness, or drainage from the incision   The clinic staff is available to answer your questions during regular business hours (8:30am-5pm).  Please dont hesitate to call and ask to speak to one of our nurses for clinical concerns.   If you have a medical emergency, go to the nearest emergency room or call 911.  A surgeon from [facility] is always on call at the [facility], [place] [address], [address], [place], [place]  [address] ? MAIN: [phone] ? TOLL FREE: [phone] ?  FAX [phone] [email or web address]

## 2015-08-18 NOTE — Op Note (Signed)
OPERATIVE NOTE-LAPAROSCOPIC CHOLECYSTECTOMY  Preoperative diagnosis:  Symptomatic cholelithiasis. Super morbid obesity  Postoperative diagnosis:  Same  Procedure: Laparoscopic cholecystectomy   Surgeon: Avel Peace, M.D.  Asst.:  Claud Kelp, MD  Anesthesia: General  Findings: There was a gallstone impacted in the cystic duct just be on the neck of the gallbladder. The remaining gallstones in the gallbladder were large.  Indication:   This is a 65 year old male with super morbid obesity who has persistently symptomatic cholelithiasis. Preoperative liver function tests are normal. He now presents for elective cholecystectomy. We have discussed the fact that his risks are increased given his super morbid obesity.  Technique: He was brought to the operating room, placed supine on the operating table, and a general anesthetic was administered. The hair on the abdominal wall was clipped as was necessary. The abdominal wall was then sterilely prepped and draped.  A timeout was performed.    Local anesthetic (Marcaine) was infiltrated in the subumbilical region. A small subumbilical incision was made through the skin, subcutaneous tissue, fascia, and peritoneum entering the peritoneal cavity under direct vision. A pursestring suture of 0 Vicryl was placed around the edges of the fascia. A Hassan trocar was introduced into the peritoneal cavity and a pneumoperitoneum was created by insufflation of carbon dioxide gas. The laparoscope was introduced into the trocar and no underlying bleeding or organ injury was noted. He was then placed in the reverse Trendelenburg position with the right side tilted slightly up.  Three 5 mm trocars were then placed into the abdominal cavity under laparoscopic vision. One in the epigastric area, and 2 in the right upper quadrant area. The gallbladder was visualized and the fundus was grasped.  There were adhesions between the omentum and edge of the liver medially  and these were taken down bluntly and sharply. There were adhesions between the omentum and gallbladder which were separated bluntly. A cousin of his body habitus, it was difficult to retract the fundus toward the right shoulder. I added another trocar just off the midline in the midepigastric area to assist with retraction and dissection.  Using blunt and sharp dissection and staying on the gallbladder, I began to mobilize the infundibulum and distal body of the gallbladder free from the liver. Identified a small cystic artery was clipped and divided in the triangle of Calot.   The cystic duct was identified.  There were dense chronic inflammatory changes noted at the neck of the gallbladder and cystic duct as well as a dilated firm mass in the cystic duct measuring approximately 10-12 mm. I isolated the cystic duct with blunt dissection medially where it was decompressed and more normal in caliber, and a window was created around it.  The critical view was achieved. A clamp was placed at the neck of the gallbladder. A small incision was made in the cystic duct/neck junction. I attempted to milk this stone out of the cystic duct through that incision but it was impacted. During milking it, posterior defect of the cystic duct was noted where the stone was.  Because I had identified the anatomy well, there are function tests were normal, and I felt the risks of trying to do a cholangiogram outweighed the benefit at this time, the cystic duct was then clipped 3 times proximally and divided. The stone remained impacted in the portion of the cystic duct that was being removed.  No bile leak was noted from the cystic duct stump.   Following this the gallbladder was dissected free  from the liver using electrocautery. The gallbladder was then placed in a retrieval bag and removed from the abdominal cavity through the subumbilical incision.  The gallbladder fossa was inspected, irrigated, and bleeding was controlled  with electrocautery. Inspection showed that hemostasis was adequate and there was no evidence of bile leak.  The irrigation fluid was evacuated as much as possible.  Surgicel was placed in the gallbladder fossa. I then placed a 19 Blake drain through the right lateral trocar incision and positioned into the gallbladder fossa. It was anchored to the skin with 3-0 nylon suture. The remaining trochars were removed and pneumoperitoneum released. The subumbilical fascial defect was closed by tying down the pursestring suture.   The skin incisions were closed with 4-0 Monocryl subcuticular stitches. Steri-Strips and sterile dressings were applied.  The procedure was well-tolerated without any apparent complications. He was taken to the recovery room in satisfactory condition.  Of note is that because of his super morbid obesity, the operation took twice as long and required placement of a fifth trocar.

## 2015-08-18 NOTE — Anesthesia Postprocedure Evaluation (Signed)
Anesthesia Post Note  Patient: Gary [name]  Procedure(s) Performed: Procedure(s) (LRB): LAPAROSCOPIC CHOLECYSTECTOMY (N/A)  Patient location during evaluation: PACU Anesthesia Type: General Level of consciousness: awake and alert Pain management: pain level controlled Vital Signs Assessment: post-procedure vital signs reviewed and stable Respiratory status: spontaneous breathing, nonlabored ventilation, respiratory function stable and patient connected to nasal cannula oxygen Cardiovascular status: blood pressure returned to baseline and stable Postop Assessment: no signs of nausea or vomiting Anesthetic complications: no    Last Vitals:  Vitals:   08/18/15 1115 08/18/15 1130  BP: (!) 142/83 (!) 142/67  Pulse: 76 75  Resp: 15 (!) 21  Temp:      Last Pain:  Vitals:   08/18/15 1145  TempSrc:   PainSc: Asleep                 Gamaliel Charney L

## 2015-08-18 NOTE — H&P (Signed)
H&P  Gary Peace, MD  Surgery    [place] [facility]  Patient #: [id number] DOB: August 31, 1950 Single / Language: [place] / Race: White Male  History of Present IllnessThe patient is a 65 year old male.   Note:He is referred by Dr. Renne Crigler for consultation regarding upper abdominal pain and cholelithiasis. Recently, he has developed epigastric abdominal pain that radiates to the right upper quadrant and toward the left upper quadrant especially after eating fatty foods. This has been associated with nausea and intermittent vomiting. Because of this, he is not eating as much. He presented to Dr. Norma Fredrickson with this complaint. A CT scan was performed. I reviewed the images. It demonstrates a small hiatal hernia. Cholelithiasis is present. There is a hernia above the umbilicus containing fat. He is referred here to discuss cholecystectomy.  Of note is he has paroxysmal atrial fibrillation. He was placed on Eliquis by Dr. Donnie Aho but stopped it because it was too expensive. He has not told Dr. Ladona Ridgel this. He also has obstructive sleep apnea and it was recommended he be on CPAP but he elected not to go on CPAP. He lives alone. He does have family locally. He gets short of breath after climbing approximately 3 steps and walks with a cane. Dyspnea keeps him from walking more than 100 yards.  Problem List/Past Medical  MORBID OBESITY WITH BMI OF 60.0-69.9, ADULT (E66.01)  Other Problems  Arthritis Atrial Fibrillation Back Pain Chest pain Cholelithiasis Congestive Heart Failure Depression Gastroesophageal Reflux Disease High blood pressure Sleep Apnea Vascular Disease  Past Surgical History  Colon Polyp Removal - Colonoscopy Gastric Bypass Oral Surgery    Allergies  No Known Drug Allergies 07/07/2015  Prior to Admission medications   Medication Sig Start Date End Date Taking? Authorizing Provider  aspirin EC 81 MG tablet Take 81 mg by mouth daily.    Yes Historical Provider, MD  atorvastatin (LIPITOR) 40 MG tablet Take 40 mg by mouth daily.   Yes Historical Provider, MD  buPROPion (WELLBUTRIN XL) 300 MG 24 hr tablet Take 300 mg by mouth daily.   Yes Historical Provider, MD  furosemide (LASIX) 40 MG tablet Take 40 mg by mouth daily.   Yes Historical Provider, MD  HYDROcodone-acetaminophen (NORCO) 10-325 MG tablet Take 1 tablet by mouth every 6 (six) hours as needed for moderate pain.   Yes Historical Provider, MD  lisinopril-hydrochlorothiazide (PRINZIDE,ZESTORETIC) 20-25 MG per tablet Take 1 tablet by mouth daily.   Yes Historical Provider, MD  meloxicam (MOBIC) 15 MG tablet Take 15 mg by mouth daily.   Yes Historical Provider, MD  Multiple Vitamin (MULTIVITAMIN WITH MINERALS) TABS tablet Take 1 tablet by mouth daily.   Yes Historical Provider, MD  omeprazole (PRILOSEC) 20 MG capsule Take 20 mg by mouth daily.   Yes Historical Provider, MD  vitamin B-12 (CYANOCOBALAMIN) 1000 MCG tablet Take 1,000 mcg by mouth daily.   Yes Historical Provider, MD  polyethylene glycol (MIRALAX / GLYCOLAX) packet Take 17 g by mouth daily.    Historical Provider, MD  rOPINIRole (REQUIP) 0.5 MG tablet Take 0.5 mg by mouth 3 (three) times daily as needed (restless legs).     Historical Provider, MD  traMADol (ULTRAM) 50 MG tablet Take 50 mg by mouth every 6 (six) hours as needed for moderate pain.     Historical Provider, MD    Social History  Alcohol use Occasional alcohol use. Caffeine use Carbonated beverages, Coffee, Tea. No drug use Tobacco use Former smoker.  Family History  Arthritis Brother, Father, Mother, Sister. Cerebrovascular Accident Father, Mother. Colon Polyps Brother. Depression Mother. Diabetes Mellitus Father. Heart Disease Father, Mother. Heart disease in male family member before age 42 Heart disease in male family member before age 46 Hypertension Brother, Mother. Malignant Neoplasm Of Pancreas Mother. Seizure  disorder Father, Sister.     Review of Systems  General Present- Appetite Loss and Fatigue. Not Present- Chills, Fever, Night Sweats, Weight Gain and Weight Loss. Skin Present- Dryness. Not Present- Change in Wart/Mole, Hives, Jaundice, New Lesions, Non-Healing Wounds, Rash and Ulcer. HEENT Present- Wears glasses/contact lenses. Not Present- Earache, Hearing Loss, Hoarseness, Nose Bleed, Oral Ulcers, Ringing in the Ears, Seasonal Allergies, Sinus Pain, Sore Throat, Visual Disturbances and Yellow Eyes. Respiratory Present- Snoring. Not Present- Bloody sputum, Chronic Cough, Difficulty Breathing and Wheezing. Breast Not Present- Breast Mass, Breast Pain, Nipple Discharge and Skin Changes. Cardiovascular Present- Chest Pain, Difficulty Breathing Lying Down, Shortness of Breath and Swelling of Extremities. Not Present- Leg Cramps, Palpitations and Rapid Heart Rate. Gastrointestinal Present- Abdominal Pain, Bloating, Constipation, Difficulty Swallowing, Gets full quickly at meals, Indigestion, Nausea and Vomiting. Not Present- Bloody Stool, Change in Bowel Habits, Chronic diarrhea, Excessive gas, Hemorrhoids and Rectal Pain. Male Genitourinary Present- Frequency and Urgency. Not Present- Blood in Urine, Change in Urinary Stream, Impotence, Nocturia, Painful Urination and Urine Leakage. Musculoskeletal Present- Joint Pain, Joint Stiffness, Muscle Pain and Swelling of Extremities. Not Present- Back Pain and Muscle Weakness. Neurological Present- Numbness, Tingling, Trouble walking and Weakness. Not Present- Decreased Memory, Fainting, Headaches, Seizures and Tremor. Psychiatric Present- Depression. Not Present- Anxiety, Bipolar, Change in Sleep Pattern, Fearful and Frequent crying. Endocrine Not Present- Cold Intolerance, Excessive Hunger, Hair Changes, Heat Intolerance, Hot flashes and New Diabetes. Hematology Present- Easy Bruising. Not Present- Excessive bleeding, Gland problems, HIV and  Persistent Infections.   Physical Exam   The physical exam findings are as follows: Note:General: Morbidly obese male in NAD. Pleasant and cooperative.  HEENT: [place]/AT, no facial masses  EYES: EOMI, no icterus  CV: RRR  CHEST: Breath sounds equal and clear. Respirations nonlabored.  ABDOMEN: Soft, massively obese, upper midline scar with some inferior fullness, mild tenderness in right upper quadrant and epigastric regions.  MUSCULOSKELETAL: 1 + edema, venous stasis changes present  SKIN: No jaundice.  NEUROLOGIC: Alert and oriented, answers questions appropriately.  PSYCHIATRIC: Normal mood, affect , and behavior.    Assessment & Plan  SYMPTOMATIC CHOLELITHIASIS (K80.20) Impression: Symptoms exacerbated after eating fatty foods. He has a number of significant comorbidities which are not optimally treated due to lack of compliance. He is aware of this.  Dr. Donnie Aho feels he is an acceptable operative risk.  Plan: Laparoscopic possible open cholecystectomy. I would not address the incisional hernia at that time. I have explained the procedure, risks, and aftercare of cholecystectomy. Risks include but are not limited to bleeding, infection, wound problems, anesthesia, diarrhea, bile leak, injury to common bile duct/liver/intestine, prolonged mechanical ventilation, cardiac dysrhythmia, death. He seems to understand and agrees with the plan. Would keep him overnight after the surgery. He states he could stay with his brother postoperatively.  Gary Peace, MD

## 2015-08-18 NOTE — Anesthesia Procedure Notes (Addendum)
Procedure Name: Intubation Date/Time: 08/18/2015 8:35 AM Performed by: Jarvis Newcomer A Pre-anesthesia Checklist: Patient identified, Emergency Drugs available, Suction available and Patient being monitored Patient Re-evaluated:Patient Re-evaluated prior to inductionOxygen Delivery Method: Circle system utilized Preoxygenation: Pre-oxygenation with 100% oxygen Intubation Type: IV induction Ventilation: Mask ventilation without difficulty Laryngoscope Size: Mac and 4 Grade View: Grade I Tube type: Oral Tube size: 7.5 mm Number of attempts: 1 Airway Equipment and Method: Stylet Placement Confirmation: ETT inserted through vocal cords under direct vision,  positive ETCO2 and breath sounds checked- equal and bilateral Secured at: 23 cm Tube secured with: Tape Dental Injury: Teeth and Oropharynx as per pre-operative assessment

## 2015-08-18 NOTE — Transfer of Care (Signed)
Immediate Anesthesia Transfer of Care Note  Patient: Gary [name]  Procedure(s) Performed: Procedure(s): LAPAROSCOPIC CHOLECYSTECTOMY (N/A)  Patient Location: PACU  Anesthesia Type:General  Level of Consciousness: awake, alert , oriented and patient cooperative  Airway & Oxygen Therapy: Patient Spontanous Breathing and Patient connected to face mask oxygen  Post-op Assessment: Report given to RN, Post -op Vital signs reviewed and stable and Patient moving all extremities  Post vital signs: Reviewed and stable  Last Vitals:  Vitals:   08/18/15 0651  BP: 127/77  Pulse: 86  Resp: 18  Temp: 36.7 C    Last Pain:  Vitals:   08/18/15 0702  TempSrc:   PainSc: 2       Patients Stated Pain Goal: 4 (08/18/15 8341)  Complications: No apparent anesthesia complications

## 2015-08-19 DIAGNOSIS — K8010 Calculus of gallbladder with chronic cholecystitis without obstruction: Secondary | ICD-10-CM | POA: Diagnosis not present

## 2015-08-19 MED ORDER — HYDROCODONE-ACETAMINOPHEN 5-325 MG PO TABS: 1.0000 | ORAL_TABLET | ORAL | 0 refills | Status: DC | PRN

## 2015-08-19 NOTE — Discharge Summary (Signed)
Physician Discharge Summary  Patient ID: Gary [name] MRN: [id number] DOB/AGE: [birth year] 65 y.o.  Admit date: 08/18/2015 Discharge date: 08/19/2015  Admission Diagnoses:  Symptomatic cholelithiasis  Discharge Diagnoses:  Active Problems:   Symptomatic cholelithiasis with chronic cholecystitis s/p laparoscopic cholecystectomy 08/18/15   Discharged Condition: good  Hospital Course: He underwent the above procedure and tolerated it well.  He was in NSR on telemetry.  He was able to be discharged on POD #1.    Plan:  Discharge to home with drain in.  Printed instructions given to him.  Follow up in office next week for drain removal.     Discharge Exam: Blood pressure (!) 114/53, pulse 79, temperature 98.7 F (37.1 C), temperature source Oral, resp. rate 18, height 5\' 10"  (1.778 m), weight (!) 184.2 kg (406 lb), SpO2 98 %.   Disposition: 01-Home or Self Care     Medication List    STOP taking these medications   HYDROcodone-acetaminophen 10-325 MG tablet Commonly known as:  NORCO Replaced by:  HYDROcodone-acetaminophen 5-325 MG tablet     TAKE these medications   aspirin EC 81 MG tablet Take 81 mg by mouth daily.   atorvastatin 40 MG tablet Commonly known as:  LIPITOR Take 40 mg by mouth daily.   buPROPion 300 MG 24 hr tablet Commonly known as:  WELLBUTRIN XL Take 300 mg by mouth daily.   furosemide 40 MG tablet Commonly known as:  LASIX Take 40 mg by mouth daily.   HYDROcodone-acetaminophen 5-325 MG tablet Commonly known as:  NORCO/VICODIN Take 1-2 tablets by mouth every 4 (four) hours as needed for moderate pain or severe pain. Replaces:  HYDROcodone-acetaminophen 10-325 MG tablet   lisinopril-hydrochlorothiazide 20-25 MG tablet Commonly known as:  PRINZIDE,ZESTORETIC Take 1 tablet by mouth daily.   meloxicam 15 MG tablet Commonly known as:  MOBIC Take 15 mg by mouth daily.   multivitamin with minerals Tabs tablet Take 1 tablet by mouth daily.    omeprazole 20 MG capsule Commonly known as:  PRILOSEC Take 20 mg by mouth daily.   polyethylene glycol packet Commonly known as:  MIRALAX / GLYCOLAX Take 17 g by mouth daily.   rOPINIRole 0.5 MG tablet Commonly known as:  REQUIP Take 0.5 mg by mouth 3 (three) times daily as needed (restless legs).   traMADol 50 MG tablet Commonly known as:  ULTRAM Take 50 mg by mouth every 6 (six) hours as needed for moderate pain.   vitamin B-12 1000 MCG tablet Commonly known as:  CYANOCOBALAMIN Take 1,000 mcg by mouth daily.        Signed: [name] 08/19/2015, 9:19 AM

## 2015-08-19 NOTE — Progress Notes (Signed)
1 Day Post-Op  Subjective: Comfortable.  Voiding well.  Objective: Vital signs in last 24 hours: Temp:  [97 F (36.1 C)-99.9 F (37.7 C)] 98.7 F (37.1 C) (08/03 0521) Pulse Rate:  [75-92] 79 (08/03 0521) Resp:  [15-20] 18 (08/03 0521) BP: (114-163)/(53-83) 114/53 (08/03 0521) SpO2:  [97 %-100 %] 98 % (08/03 0521) Last BM Date: 08/17/15  Intake/Output from previous day: 08/02 0701 - 08/03 0700 In: 2313.8 [I.V.:2263.8; IV Piggyback:50] Out: 2535 [Urine:2350; Drains:160; Blood:25] Intake/Output this shift: Total I/O In: -  Out: 633 [Urine:600; Drains:33]  PE: General- In NAD Abdomen-soft, dressings dry, thin serosanguinous drain output  Lab Results:  No results for input(s): WBC, HGB, HCT, PLT in the last 72 hours. BMET No results for input(s): NA, K, CL, CO2, GLUCOSE, BUN, CREATININE, CALCIUM in the last 72 hours. PT/INR No results for input(s): LABPROT, INR in the last 72 hours. Comprehensive Metabolic Panel:    Component Value Date/Time   NA 139 08/12/2015 1440   NA 138 09/14/2014 1511   K 4.7 08/12/2015 1440   K 3.8 09/14/2014 1511   CL 104 08/12/2015 1440   CL 100 09/14/2014 1511   CO2 27 08/12/2015 1440   CO2 30 09/14/2014 1511   BUN 29 (H) 08/12/2015 1440   BUN 16 09/14/2014 1511   CREATININE 1.72 (H) 08/12/2015 1440   CREATININE 1.12 09/14/2014 1511   GLUCOSE 101 (H) 08/12/2015 1440   GLUCOSE 98 09/14/2014 1511   CALCIUM 9.6 08/12/2015 1440   CALCIUM 9.6 09/14/2014 1511   AST 26 08/12/2015 1440   ALT 16 (L) 08/12/2015 1440   ALKPHOS 71 08/12/2015 1440   BILITOT 0.7 08/12/2015 1440   PROT 7.8 08/12/2015 1440   ALBUMIN 4.3 08/12/2015 1440     Studies/Results: No results found.  Anti-infectives: Anti-infectives    Start     Dose/Rate Route Frequency Ordered Stop   08/18/15 1600  ceFAZolin (ANCEF) IVPB 2g/100 mL premix     2 g 200 mL/hr over 30 Minutes Intravenous Every 8 hours 08/18/15 1317 08/18/15 1821   08/18/15 0600  ceFAZolin (ANCEF) 3 g  in dextrose 5 % 50 mL IVPB     3 g 130 mL/hr over 30 Minutes Intravenous On call to O.R. 08/17/15 1340 08/18/15 0839      Assessment Active Problems:   Symptomatic cholelithiasis with chronic cholecystitis s/p laparoscopic cholecystectomy 08/18/15-doing well   PAF-NSR postop     LOS: 1 day   Plan: Discharge to home with drain in.  Printed instructions given to him.  Follow up in office next week for drain removal.   Gary [name] 08/19/2015

## 2015-09-09 ENCOUNTER — Other Ambulatory Visit (HOSPITAL_COMMUNITY)
Admission: RE | Admit: 2015-09-09 | Discharge: 2015-09-09 | Disposition: A | Discharge status: Home / Self Care | Payer: BLUE CROSS/BLUE SHIELD | Source: Ambulatory Visit | Attending: General Surgery | Admitting: General Surgery

## 2015-09-09 ENCOUNTER — Ambulatory Visit (HOSPITAL_COMMUNITY)
Admission: RE | Admit: 2015-09-09 | Discharge: 2015-09-09 | Disposition: A | Discharge status: Home / Self Care | Payer: BLUE CROSS/BLUE SHIELD | Source: Ambulatory Visit | Attending: General Surgery | Admitting: General Surgery

## 2015-09-09 ENCOUNTER — Other Ambulatory Visit (HOSPITAL_COMMUNITY): Payer: Self-pay | Admitting: General Surgery

## 2015-09-09 ENCOUNTER — Inpatient Hospital Stay (HOSPITAL_COMMUNITY)
Admission: EM | Admit: 2015-09-09 | Discharge: 2015-09-14 | DRG: 863 | Disposition: A | Discharge status: Home Health | Payer: BLUE CROSS/BLUE SHIELD | Attending: Internal Medicine | Admitting: Internal Medicine

## 2015-09-09 ENCOUNTER — Emergency Department (HOSPITAL_COMMUNITY): Payer: BLUE CROSS/BLUE SHIELD

## 2015-09-09 ENCOUNTER — Encounter: Payer: Self-pay | Admitting: Surgery

## 2015-09-09 ENCOUNTER — Encounter (HOSPITAL_COMMUNITY): Payer: Self-pay | Admitting: Emergency Medicine

## 2015-09-09 DIAGNOSIS — Z9884 Bariatric surgery status: Secondary | ICD-10-CM

## 2015-09-09 DIAGNOSIS — F329 Major depressive disorder, single episode, unspecified: Secondary | ICD-10-CM | POA: Diagnosis present

## 2015-09-09 DIAGNOSIS — K432 Incisional hernia without obstruction or gangrene: Secondary | ICD-10-CM | POA: Diagnosis present

## 2015-09-09 DIAGNOSIS — Z791 Long term (current) use of non-steroidal anti-inflammatories (NSAID): Secondary | ICD-10-CM

## 2015-09-09 DIAGNOSIS — K219 Gastro-esophageal reflux disease without esophagitis: Secondary | ICD-10-CM | POA: Insufficient documentation

## 2015-09-09 DIAGNOSIS — R1084 Generalized abdominal pain: Secondary | ICD-10-CM

## 2015-09-09 DIAGNOSIS — R1011 Right upper quadrant pain: Secondary | ICD-10-CM | POA: Diagnosis not present

## 2015-09-09 DIAGNOSIS — Y836 Removal of other organ (partial) (total) as the cause of abnormal reaction of the patient, or of later complication, without mention of misadventure at the time of the procedure: Secondary | ICD-10-CM | POA: Diagnosis present

## 2015-09-09 DIAGNOSIS — L02211 Cutaneous abscess of abdominal wall: Secondary | ICD-10-CM | POA: Diagnosis present

## 2015-09-09 DIAGNOSIS — I700 Atherosclerosis of aorta: Secondary | ICD-10-CM | POA: Insufficient documentation

## 2015-09-09 DIAGNOSIS — T814XXA Infection following a procedure, initial encounter: Secondary | ICD-10-CM | POA: Diagnosis not present

## 2015-09-09 DIAGNOSIS — Z79899 Other long term (current) drug therapy: Secondary | ICD-10-CM

## 2015-09-09 DIAGNOSIS — I130 Hypertensive heart and chronic kidney disease with heart failure and stage 1 through stage 4 chronic kidney disease, or unspecified chronic kidney disease: Secondary | ICD-10-CM | POA: Diagnosis present

## 2015-09-09 DIAGNOSIS — I2510 Atherosclerotic heart disease of native coronary artery without angina pectoris: Secondary | ICD-10-CM

## 2015-09-09 DIAGNOSIS — N179 Acute kidney failure, unspecified: Secondary | ICD-10-CM | POA: Diagnosis present

## 2015-09-09 DIAGNOSIS — N183 Chronic kidney disease, stage 3 unspecified: Secondary | ICD-10-CM | POA: Insufficient documentation

## 2015-09-09 DIAGNOSIS — D62 Acute posthemorrhagic anemia: Secondary | ICD-10-CM | POA: Diagnosis present

## 2015-09-09 DIAGNOSIS — Z9049 Acquired absence of other specified parts of digestive tract: Secondary | ICD-10-CM

## 2015-09-09 DIAGNOSIS — B9561 Methicillin susceptible Staphylococcus aureus infection as the cause of diseases classified elsewhere: Secondary | ICD-10-CM | POA: Diagnosis present

## 2015-09-09 DIAGNOSIS — L03311 Cellulitis of abdominal wall: Secondary | ICD-10-CM | POA: Diagnosis present

## 2015-09-09 DIAGNOSIS — I129 Hypertensive chronic kidney disease with stage 1 through stage 4 chronic kidney disease, or unspecified chronic kidney disease: Secondary | ICD-10-CM | POA: Diagnosis present

## 2015-09-09 DIAGNOSIS — E869 Volume depletion, unspecified: Secondary | ICD-10-CM | POA: Diagnosis present

## 2015-09-09 DIAGNOSIS — Z8249 Family history of ischemic heart disease and other diseases of the circulatory system: Secondary | ICD-10-CM

## 2015-09-09 DIAGNOSIS — E871 Hypo-osmolality and hyponatremia: Secondary | ICD-10-CM | POA: Diagnosis present

## 2015-09-09 DIAGNOSIS — F419 Anxiety disorder, unspecified: Secondary | ICD-10-CM | POA: Diagnosis present

## 2015-09-09 DIAGNOSIS — Z7982 Long term (current) use of aspirin: Secondary | ICD-10-CM

## 2015-09-09 DIAGNOSIS — G4730 Sleep apnea, unspecified: Secondary | ICD-10-CM | POA: Diagnosis present

## 2015-09-09 DIAGNOSIS — Z87891 Personal history of nicotine dependence: Secondary | ICD-10-CM

## 2015-09-09 DIAGNOSIS — I4891 Unspecified atrial fibrillation: Secondary | ICD-10-CM | POA: Diagnosis present

## 2015-09-09 DIAGNOSIS — Z6841 Body Mass Index (BMI) 40.0 and over, adult: Secondary | ICD-10-CM

## 2015-09-09 DIAGNOSIS — Z800 Family history of malignant neoplasm of digestive organs: Secondary | ICD-10-CM

## 2015-09-09 DIAGNOSIS — Z833 Family history of diabetes mellitus: Secondary | ICD-10-CM

## 2015-09-09 LAB — COMPREHENSIVE METABOLIC PANEL
ALT: 20 U/L (ref 17–63)
AST: 30 U/L (ref 15–41)
Albumin: 3 g/dL — ABNORMAL LOW (ref 3.5–5.0)
Alkaline Phosphatase: 77 U/L (ref 38–126)
Anion gap: 12 (ref 5–15)
BUN: 28 mg/dL — ABNORMAL HIGH (ref 6–20)
CHLORIDE: 98 mmol/L — AB (ref 101–111)
CO2: 24 mmol/L (ref 22–32)
Calcium: 8.7 mg/dL — ABNORMAL LOW (ref 8.9–10.3)
Creatinine, Ser: 2.28 mg/dL — ABNORMAL HIGH (ref 0.61–1.24)
GFR, EST AFRICAN AMERICAN: 33 mL/min — AB (ref >60)
GFR, EST NON AFRICAN AMERICAN: 29 mL/min — AB (ref >60)
Glucose, Bld: 112 mg/dL — ABNORMAL HIGH (ref 65–99)
POTASSIUM: 3.7 mmol/L (ref 3.5–5.1)
SODIUM: 134 mmol/L — AB (ref 135–145)
Total Bilirubin: 0.8 mg/dL (ref 0.3–1.2)
Total Protein: 6.8 g/dL (ref 6.5–8.1)

## 2015-09-09 LAB — CBC WITH DIFFERENTIAL/PLATELET
BASOS ABS: 0 10*3/uL (ref 0.0–0.1)
Basophils Relative: 0 %
Eosinophils Absolute: 0.1 10*3/uL (ref 0.0–0.7)
Eosinophils Relative: 0 %
HCT: 28.7 % — ABNORMAL LOW (ref 39.0–52.0)
Hemoglobin: 9.1 g/dL — ABNORMAL LOW (ref 13.0–17.0)
LYMPHS ABS: 0.5 10*3/uL — AB (ref 0.7–4.0)
LYMPHS PCT: 3 %
MCH: 29.4 pg (ref 26.0–34.0)
MCHC: 31.7 g/dL (ref 30.0–36.0)
MCV: 92.6 fL (ref 78.0–100.0)
Monocytes Absolute: 1.5 10*3/uL — ABNORMAL HIGH (ref 0.1–1.0)
Monocytes Relative: 8 %
NEUTROS PCT: 89 %
Neutro Abs: 15.4 10*3/uL — ABNORMAL HIGH (ref 1.7–7.7)
PLATELETS: 258 10*3/uL (ref 150–400)
RBC: 3.1 MIL/uL — ABNORMAL LOW (ref 4.22–5.81)
RDW: 15.2 % (ref 11.5–15.5)
WBC: 17.4 10*3/uL — AB (ref 4.0–10.5)

## 2015-09-09 MED ORDER — BISACODYL 10 MG RE SUPP: 10.0000 mg | Freq: Two times a day (BID) | RECTAL | Status: DC | PRN

## 2015-09-09 MED ORDER — SODIUM CHLORIDE 0.9 % IV BOLUS (SEPSIS)
1000.0000 mL | Freq: Once | INTRAVENOUS | Status: AC
  Administered 2015-09-10: 1000 mL via INTRAVENOUS

## 2015-09-09 MED ORDER — BUPROPION HCL ER (XL) 150 MG PO TB24
300.0000 mg | ORAL_TABLET | Freq: Every day | ORAL | Status: DC
  Administered 2015-09-10 – 2015-09-14 (×5): 300 mg via ORAL
  Filled 2015-09-09 (×5): qty 2

## 2015-09-09 MED ORDER — PANTOPRAZOLE SODIUM 40 MG PO TBEC
80.0000 mg | DELAYED_RELEASE_TABLET | Freq: Every day | ORAL | Status: DC
  Administered 2015-09-10 – 2015-09-14 (×5): 80 mg via ORAL
  Filled 2015-09-09 (×5): qty 2

## 2015-09-09 MED ORDER — SODIUM CHLORIDE 0.9 % IV SOLN
INTRAVENOUS | Status: DC
  Administered 2015-09-10 – 2015-09-12 (×5): via INTRAVENOUS
  Administered 2015-09-13: 100 mL/h via INTRAVENOUS
  Administered 2015-09-13 (×2): via INTRAVENOUS

## 2015-09-09 MED ORDER — DIPHENHYDRAMINE HCL 50 MG/ML IJ SOLN: 12.5000 mg | Freq: Four times a day (QID) | INTRAMUSCULAR | Status: DC | PRN

## 2015-09-09 MED ORDER — VITAMIN B-12 1000 MCG PO TABS
1000.0000 ug | ORAL_TABLET | Freq: Every day | ORAL | Status: DC
  Administered 2015-09-10 – 2015-09-14 (×5): 1000 ug via ORAL
  Filled 2015-09-09 (×5): qty 1

## 2015-09-09 MED ORDER — ONDANSETRON HCL 4 MG/2ML IJ SOLN: 4.0000 mg | Freq: Four times a day (QID) | INTRAMUSCULAR | Status: DC | PRN

## 2015-09-09 MED ORDER — SULFAMETHOXAZOLE-TRIMETHOPRIM 800-160 MG PO TABS: 1.0000 | ORAL_TABLET | Freq: Two times a day (BID) | ORAL | 0 refills | Status: DC

## 2015-09-09 MED ORDER — METHOCARBAMOL 1000 MG/10ML IJ SOLN
1000.0000 mg | Freq: Four times a day (QID) | INTRAVENOUS | Status: DC | PRN
  Filled 2015-09-09: qty 10

## 2015-09-09 MED ORDER — MENTHOL 3 MG MT LOZG: 1.0000 | LOZENGE | OROMUCOSAL | Status: DC | PRN

## 2015-09-09 MED ORDER — BISMUTH SUBSALICYLATE 262 MG/15ML PO SUSP: 30.0000 mL | Freq: Three times a day (TID) | ORAL | Status: DC | PRN

## 2015-09-09 MED ORDER — POLYETHYLENE GLYCOL 3350 17 G PO PACK: 17.0000 g | PACK | Freq: Two times a day (BID) | ORAL | Status: DC | PRN

## 2015-09-09 MED ORDER — SULFAMETHOXAZOLE-TRIMETHOPRIM 800-160 MG PO TABS: 1.0000 | ORAL_TABLET | Freq: Once | ORAL | Status: DC

## 2015-09-09 MED ORDER — ATORVASTATIN CALCIUM 40 MG PO TABS
40.0000 mg | ORAL_TABLET | Freq: Every day | ORAL | Status: DC
Start: 2015-09-10 — End: 2015-09-14
  Administered 2015-09-10 – 2015-09-13 (×4): 40 mg via ORAL
  Filled 2015-09-09 (×3): qty 1
  Filled 2015-09-09: qty 2
  Filled 2015-09-09 (×2): qty 1
  Filled 2015-09-09 (×2): qty 2

## 2015-09-09 MED ORDER — HYDROMORPHONE HCL 1 MG/ML IJ SOLN: 0.5000 mg | INTRAMUSCULAR | Status: DC | PRN

## 2015-09-09 MED ORDER — HYDRALAZINE HCL 20 MG/ML IJ SOLN: 5.0000 mg | Freq: Four times a day (QID) | INTRAMUSCULAR | Status: DC | PRN

## 2015-09-09 MED ORDER — MAGIC MOUTHWASH
15.0000 mL | Freq: Four times a day (QID) | ORAL | Status: DC | PRN
  Filled 2015-09-09: qty 15

## 2015-09-09 MED ORDER — PHENOL 1.4 % MT LIQD: 2.0000 | OROMUCOSAL | Status: DC | PRN

## 2015-09-09 MED ORDER — POLYETHYLENE GLYCOL 3350 17 G PO PACK: 17.0000 g | PACK | Freq: Every day | ORAL | Status: DC

## 2015-09-09 MED ORDER — ROPINIROLE HCL 0.5 MG PO TABS: 0.5000 mg | ORAL_TABLET | Freq: Three times a day (TID) | ORAL | Status: DC | PRN

## 2015-09-09 MED ORDER — ADULT MULTIVITAMIN W/MINERALS CH
1.0000 | ORAL_TABLET | Freq: Every day | ORAL | Status: DC
  Administered 2015-09-10 – 2015-09-14 (×5): 1 via ORAL
  Filled 2015-09-09 (×5): qty 1

## 2015-09-09 MED ORDER — METOPROLOL TARTRATE 5 MG/5ML IV SOLN: 5.0000 mg | Freq: Four times a day (QID) | INTRAVENOUS | Status: DC | PRN

## 2015-09-09 MED ORDER — ASPIRIN EC 81 MG PO TBEC
81.0000 mg | DELAYED_RELEASE_TABLET | Freq: Every day | ORAL | Status: DC
  Administered 2015-09-10 – 2015-09-14 (×5): 81 mg via ORAL
  Filled 2015-09-09 (×5): qty 1

## 2015-09-09 MED ORDER — CEPHALEXIN 500 MG PO CAPS: 500.0000 mg | ORAL_CAPSULE | Freq: Four times a day (QID) | ORAL | 0 refills | Status: DC

## 2015-09-09 MED ORDER — ALUM & MAG HYDROXIDE-SIMETH 200-200-20 MG/5ML PO SUSP: 30.0000 mL | Freq: Four times a day (QID) | ORAL | Status: DC | PRN

## 2015-09-09 MED ORDER — CEPHALEXIN 500 MG PO CAPS: 500.0000 mg | ORAL_CAPSULE | Freq: Once | ORAL | Status: DC

## 2015-09-09 MED ORDER — ONDANSETRON HCL 40 MG/20ML IJ SOLN
8.0000 mg | Freq: Four times a day (QID) | INTRAMUSCULAR | Status: DC | PRN
  Filled 2015-09-09: qty 4

## 2015-09-09 MED ORDER — LIP MEDEX EX OINT
1.0000 "application " | TOPICAL_OINTMENT | Freq: Two times a day (BID) | CUTANEOUS | Status: DC
  Administered 2015-09-10 – 2015-09-14 (×8): 1 via TOPICAL
  Filled 2015-09-09 (×2): qty 7

## 2015-09-09 MED ORDER — TRAMADOL HCL 50 MG PO TABS: 50.0000 mg | ORAL_TABLET | Freq: Four times a day (QID) | ORAL | Status: DC | PRN

## 2015-09-09 MED ORDER — HYDROCODONE-ACETAMINOPHEN 5-325 MG PO TABS: 1.0000 | ORAL_TABLET | Freq: Four times a day (QID) | ORAL | 0 refills | Status: DC | PRN

## 2015-09-09 MED ORDER — PROCHLORPERAZINE EDISYLATE 5 MG/ML IJ SOLN: 5.0000 mg | INTRAMUSCULAR | Status: DC | PRN

## 2015-09-09 MED ORDER — CEFAZOLIN SODIUM 10 G IJ SOLR
3.0000 g | Freq: Three times a day (TID) | INTRAMUSCULAR | Status: DC
  Administered 2015-09-10: 3 g via INTRAVENOUS
  Filled 2015-09-09: qty 3
  Filled 2015-09-09: qty 3000

## 2015-09-09 MED ORDER — VITAMIN C 500 MG PO TABS
500.0000 mg | ORAL_TABLET | Freq: Two times a day (BID) | ORAL | Status: DC
  Administered 2015-09-10 – 2015-09-14 (×9): 500 mg via ORAL
  Filled 2015-09-09 (×9): qty 1

## 2015-09-09 MED ORDER — METRONIDAZOLE IN NACL 5-0.79 MG/ML-% IV SOLN
500.0000 mg | Freq: Four times a day (QID) | INTRAVENOUS | Status: DC
  Administered 2015-09-10: 500 mg via INTRAVENOUS
  Filled 2015-09-09: qty 100

## 2015-09-09 MED ORDER — ONDANSETRON 4 MG PO TBDP: 4.0000 mg | ORAL_TABLET | Freq: Four times a day (QID) | ORAL | Status: DC | PRN

## 2015-09-09 MED ORDER — LACTATED RINGERS IV BOLUS (SEPSIS): 1000.0000 mL | Freq: Three times a day (TID) | INTRAVENOUS | Status: AC | PRN

## 2015-09-09 MED ORDER — ACETAMINOPHEN 500 MG PO TABS
1000.0000 mg | ORAL_TABLET | Freq: Three times a day (TID) | ORAL | Status: DC
  Administered 2015-09-10 – 2015-09-12 (×10): 1000 mg via ORAL
  Filled 2015-09-09 (×11): qty 2

## 2015-09-09 NOTE — Discharge Instructions (Addendum)
Dressing change:  1/4 inch nugauze and dry dressing daily.  Neosporin and bandage to umbilical wound daily.  Appointment with Dr. Abbey Chattersosenbower in one week.  Please call [phone]) to make this appointment.

## 2015-09-09 NOTE — Progress Notes (Signed)
Gary [name]  30-Oct-1950 [id number]  Patient Care Team: Merri BrunetteWalter [name], [name] as PCP - General (Internal Medicine) Gary [name], [name] as Consulting Physician (General Surgery) Gary [name], [name] as Consulting Physician (Cardiology)  This patient is a 65 y.o.male who calls today for surgical evaluation.   Date of procedure/visit: 08/18/2015  Surgery: Preoperative diagnosis:  Symptomatic cholelithiasis. Super morbid obesity  Postoperative diagnosis:  Same  Procedure: Laparoscopic cholecystectomy   Surgeon: Gary [name], M.D.  Asst.:  Claud KelpHaywood [name], [name]  Diagnosis Gallbladder - CHRONIC CHOLECYSTITIS AND CHOLELITHIASIS. Gary [name] Pathologist, Electronic Signature (Case signed 08/19/2015) Specimen Gary [name] and Clinical Information Specimen(s) Obtained: Gallbladder Specimen Clinical Information symptomatic cholelithiasis (kp) Gary [name] Size/?Intact: 10.4 x 4 x 3.6 cm, intact Serosal surface: Yellow pink to hyperemic with scattered fatty adhesions. Mucosa/Wall: Tan pink to red, with scattered trabeculations and flattened areas. Wall is up to 0.2 cm thick. Contents: There are mutiple black smooth choleliths ranging from minute fragments to 1.8 cm. Cystic duct: Contains stones Block Summary: One block submitted. (SSW:kh 08-18-15) Report signed out from the following location(s) Technical component and interpretation was performed at [name] [address] [place], [place], [place] [id number]. CLIA #: [id number], 1 of 1    Reason for call: CT scan results  Pt felt better around 2 week post-op visit.  Drain d/c'd.    Pt seen by Dr Gary [name] in office today, POD#22 s/p lap chole. Came to office today feeling worse with c/o N/V & fever 100s.  Mild drainage at old tube site but no obvious rash/cellulitis.    Sent for labs & CT scan.  WBC elevated.  Cr > 2.  Discussed CT scan with DR Gary IsaacWatts with radiology.  ?Shingles flare?  D/w Dr Gary [name].  No biloma nor  perf.  No pneumonia.  Chronic incisional hernia w unobstructed SB c/w prior gastric bypass surgery - no perf/SBO.  Some stranding RUQ SQ but no abscess.  Did not seem c/w cellulitis on exam per Dr Gary [name].  He recommended ED eval for IV fluids, nausea meds, ?Medical evaluation to r/o other etiologies.  If admitted, Dr Gary [name] to f/u in AM to help follow  Patient Active Problem List   Diagnosis Date Noted  . Incisional hernia, without obstruction or gangrene 09/09/2015  . CKD (chronic kidney disease) stage 3, GFR 30-59 ml/min 09/09/2015  . Symptomatic cholelithiasis 08/18/2015  . Left facial pain 11/26/2014  . Numbness of right anterior thigh 11/26/2014  . Bilateral leg numbness 11/26/2014  . Spinal stenosis of lumbar region 11/26/2014  . Precordial pain   . CAD (coronary artery disease), native coronary artery 09/07/2014  . Sleep apnea 09/07/2014  . GERD (gastroesophageal reflux disease) 09/07/2014  . Dyspnea 09/07/2014  . Hypertensive heart disease without CHF   . Diastolic dysfunction   . Morbid obesity (HCC)   . Bariatric surgery status     Past Medical History:  Diagnosis Date  . Anxiety   . CAD (coronary artery disease), native coronary artery 09/07/2014   3 vessel coronary calcification noted on CT scan 2016   . Depression   . Diastolic dysfunction   . Dysrhythmia    hX A-Fib  . GERD (gastroesophageal reflux disease)   . History of hiatal hernia   . Hypertension   . Hypertensive heart disease without CHF   . Morbid obesity (HCC)   . Sleep apnea    does not use or have a cpap-he is supposed to use one    Past Surgical History:  Procedure Laterality Date  . CARDIAC CATHETERIZATION N/A 09/23/2014   Procedure: Left Heart Cath and Coronary Angiography;  Surgeon: Gary Hazel, [name];  Location: [name];  Service: Cardiovascular;  Laterality: N/A;  . CHOLECYSTECTOMY N/A 08/18/2015   Procedure: LAPAROSCOPIC CHOLECYSTECTOMY;  Surgeon: Gary Peace,  [name];  Location: [facility];  Service: General;  Laterality: N/A;  . COLONOSCOPY    . GASTRIC BYPASS  1982  . MOUTH SURGERY    . UPPER GI ENDOSCOPY      Social History   Social History  . Marital status: Single    Spouse name: N/A  . Number of children: 0  . Years of education: 42   Occupational History  .      [employer]   Social History Main Topics  . Smoking status: Former Smoker    Types: Cigars    Quit date: 01/16/2006  . Smokeless tobacco: Never Used  . Alcohol use 0.0 oz/week     Comment: occ  . Drug use: No  . Sexual activity: Not on file   Other Topics Concern  . Not on file   Social History Narrative   Lives alone.  Chartered certified accountant for [employer].    Caffeine use- coffee 2 cups /day    Family History  Problem Relation Age of Onset  . Heart attack Father   . Diabetes Father   . Heart failure Mother   . Pancreatic cancer Mother   . Multiple sclerosis Sister   . Diabetes Maternal Grandmother   . Heart attack Maternal Grandfather   . Diabetes Paternal Grandmother   . Heart attack Paternal Grandfather   . Hypertension Brother   . Cancer - Colon Brother   . Hypertension Maternal Aunt     Current Outpatient Prescriptions  Medication Sig Dispense Refill  . aspirin EC 81 MG tablet Take 81 mg by mouth daily.    Marland Kitchen atorvastatin (LIPITOR) 40 MG tablet Take 40 mg by mouth daily.    Marland Kitchen buPROPion (WELLBUTRIN XL) 300 MG 24 hr tablet Take 300 mg by mouth daily.    . furosemide (LASIX) 40 MG tablet Take 40 mg by mouth daily.    Marland Kitchen HYDROcodone-acetaminophen (NORCO/VICODIN) 5-325 MG tablet Take 1-2 tablets by mouth every 4 (four) hours as needed for moderate pain or severe pain. 30 tablet 0  . lisinopril-hydrochlorothiazide (PRINZIDE,ZESTORETIC) 20-25 MG per tablet Take 1 tablet by mouth daily.    . meloxicam (MOBIC) 15 MG tablet Take 15 mg by mouth daily.    . Multiple Vitamin (MULTIVITAMIN WITH MINERALS) TABS tablet Take 1 tablet by mouth daily.    Marland Kitchen omeprazole (PRILOSEC) 20  MG capsule Take 20 mg by mouth daily.    . polyethylene glycol (MIRALAX / GLYCOLAX) packet Take 17 g by mouth daily.    Marland Kitchen rOPINIRole (REQUIP) 0.5 MG tablet Take 0.5 mg by mouth 3 (three) times daily as needed (restless legs).     . traMADol (ULTRAM) 50 MG tablet Take 50 mg by mouth every 6 (six) hours as needed for moderate pain.     . vitamin B-12 (CYANOCOBALAMIN) 1000 MCG tablet Take 1,000 mcg by mouth daily.     No current facility-administered medications for this encounter.      No Known Allergies  There were no vitals taken for this visit.  Ct Abdomen Pelvis Wo Contrast  Result Date: 09/09/2015 CLINICAL DATA:  Abdominal pain, generalized. Gallbladder surgery August 18, 2015 EXAM: CT ABDOMEN AND PELVIS WITHOUT CONTRAST TECHNIQUE: Multidetector CT  imaging of the abdomen and pelvis was performed following the standard protocol without IV contrast. COMPARISON:  06/17/2015 FINDINGS: Lower chest and abdominal wall: Band of subcutaneous strandy density along the right abdomen. There is no suspected abscess. No soft tissue emphysema. Supraumbilical hernia containing fat and a loop of non edematous small bowel. Distended lower esophagus filled with oral contrast. No superimposed inflammation or wall thickening. Hepatobiliary: No focal liver abnormality.Cholecystectomy. Expected surgical site without evidence of abscess or bile leak. No visible choledocholithiasis. Pancreas: Unremarkable. Spleen: Unremarkable. Adrenals/Urinary Tract: Negative adrenals. No hydronephrosis or stone. Unremarkable bladder. Stomach/Bowel: Postoperative stomach . No inflammation or obstruction. Reproductive:No pathologic findings. Vascular/Lymphatic: Extensive aortic and branch vessel atherosclerotic calcification. No mass or adenopathy. Other: No ascites or pneumoperitoneum. Musculoskeletal: Advanced and diffuse disc and facet degeneration with spurs causing multi-level lumbar canal and foraminal stenosis. IMPRESSION: 1.  Nonspecific abdominal wall edema/inflammation along the cholecystectomy incision. No soft tissue emphysema or suspected abscess. 2. Unremarkable postoperative gallbladder fossa. 3. Distended esophagus from gastroesophageal reflux or poor clearance. 4. Supraumbilical small bowel containing hernia. 5.  Aortic Atherosclerosis (ICD10-170.0) Electronically Signed   By: Marnee Spring M.D.   On: 09/09/2015 20:15   Dg Chest 2 View  Result Date: 09/09/2015 CLINICAL DATA:  Right lower quadrant abdominal pain since cholecystectomy, cough, fever EXAM: CHEST  2 VIEW COMPARISON:  CTA chest dated 12/21/2014 FINDINGS: Lungs are clear.  No pleural effusion or pneumothorax. The heart is normal in size. Mild degenerative changes of the visualized thoracolumbar spine. IMPRESSION: Normal chest radiographs. Electronically Signed   By: Charline Bills M.D.   On: 09/09/2015 18:17    Note: This dictation was prepared with Dragon/digital dictation along with [employer]. Any transcriptional errors that result from this process are unintentional.

## 2015-09-09 NOTE — ED Provider Notes (Signed)
[facility]-EMERGENCY DEPT Provider Note   CSN: [id number] Arrival date & time: 09/09/15  2126     History   Chief Complaint Chief Complaint  Patient presents with  . Abdominal Pain    HPI Gary [name] is a 65 y.o. male.  The history is provided by the patient. No language interpreter was used.   Gary [name] is a 65 y.o. male who presents to the Emergency Department complaining of abdominal pain.  He is 3 weeks postoperative from a laparoscopic cholecystectomy. About a week ago he started to develop some intermittent right upper quadrant pain. The pain was dull and constant in nature with occasional worsening with movement. He is ago the pain significantly worsened. He followed up with the surgeon today and had lab work performed as well as a CT abdomen was referred to the emergency department for further evaluation. He reports low-grade temperatures and decreased appetite, occasional vomiting. No dysuria. He does have diarrhea. He has a chronic cough that has been present since prior to the surgery, unchanged from baseline.  Past Medical History:  Diagnosis Date  . Anxiety   . CAD (coronary artery disease), native coronary artery 09/07/2014   3 vessel coronary calcification noted on CT scan 2016   . Depression   . Diastolic dysfunction   . Dysrhythmia    hX A-Fib  . GERD (gastroesophageal reflux disease)   . History of hiatal hernia   . Hypertension   . Hypertensive heart disease without CHF   . Morbid obesity (HCC)   . Sleep apnea    does not use or have a cpap-he is supposed to use one    Patient Active Problem List   Diagnosis Date Noted  . Incisional hernia, without obstruction or gangrene 09/09/2015  . CKD (chronic kidney disease) stage 3, GFR 30-59 ml/min 09/09/2015  . Symptomatic cholelithiasis 08/18/2015  . Left facial pain 11/26/2014  . Numbness of right anterior thigh 11/26/2014  . Bilateral leg numbness 11/26/2014  . Spinal stenosis of lumbar region 11/26/2014   . Precordial pain   . CAD (coronary artery disease), native coronary artery 09/07/2014  . Sleep apnea 09/07/2014  . GERD (gastroesophageal reflux disease) 09/07/2014  . Dyspnea 09/07/2014  . Hypertensive heart disease without CHF   . Diastolic dysfunction   . Morbid obesity (HCC)   . Bariatric surgery status     Past Surgical History:  Procedure Laterality Date  . CARDIAC CATHETERIZATION N/A 09/23/2014   Procedure: Left Heart Cath and Coronary Angiography;  Surgeon: Kathleene Hazelhristopher D McAlhany, MD;  [facility];  Service: Cardiovascular;  Laterality: N/A;  . CHOLECYSTECTOMY N/A 08/18/2015   Procedure: LAPAROSCOPIC CHOLECYSTECTOMY;  Surgeon: Avel Peaceodd Rosenbower, MD;  Location: [facility] ORS;  Service: General;  Laterality: N/A;  . COLONOSCOPY    . GASTRIC BYPASS  1982  . MOUTH SURGERY    . UPPER GI ENDOSCOPY         Home Medications    Prior to Admission medications   Medication Sig Start Date End Date Taking? Authorizing Provider  aspirin EC 81 MG tablet Take 81 mg by mouth daily.   Yes Historical Provider, MD  atorvastatin (LIPITOR) 40 MG tablet Take 40 mg by mouth daily.   Yes Historical Provider, MD  buPROPion (WELLBUTRIN XL) 300 MG 24 hr tablet Take 300 mg by mouth daily.   Yes Historical Provider, MD  furosemide (LASIX) 40 MG tablet Take 40 mg by mouth daily.   Yes Historical Provider, MD  lisinopril-hydrochlorothiazide (PRINZIDE,ZESTORETIC) 20-25 MG per tablet Take 1 tablet by mouth daily.   Yes Historical Provider, MD  meloxicam (MOBIC) 15 MG tablet Take 15 mg by mouth daily.   Yes Historical Provider, MD  Multiple Vitamin (MULTIVITAMIN WITH MINERALS) TABS tablet Take 1 tablet by mouth daily.   Yes Historical Provider, MD  omeprazole (PRILOSEC) 20 MG capsule Take 20 mg by mouth daily.   Yes Historical Provider, MD  oxyCODONE-acetaminophen (PERCOCET/ROXICET) 5-325 MG tablet Take 1-2 tablets by mouth every 4 (four) hours as needed for severe pain.   Yes Historical Provider,  MD  polyethylene glycol (MIRALAX / GLYCOLAX) packet Take 17 g by mouth daily.   Yes Historical Provider, MD  rOPINIRole (REQUIP) 0.5 MG tablet Take 0.5 mg by mouth 3 (three) times daily as needed (restless legs).    Yes Historical Provider, MD  traMADol (ULTRAM) 50 MG tablet Take 50 mg by mouth every 6 (six) hours as needed for moderate pain.    Yes Historical Provider, MD  vitamin B-12 (CYANOCOBALAMIN) 1000 MCG tablet Take 1,000 mcg by mouth daily.   Yes Historical Provider, MD  cephALEXin (KEFLEX) 500 MG capsule Take 1 capsule (500 mg total) by mouth 4 (four) times daily. 09/09/15   Tilden FossaElizabeth Aarish Rockers, MD  HYDROcodone-acetaminophen (NORCO/VICODIN) 5-325 MG tablet Take 1 tablet by mouth every 6 (six) hours as needed. 09/09/15   Tilden FossaElizabeth Dyson Sevey, MD  sulfamethoxazole-trimethoprim (BACTRIM DS,SEPTRA DS) 800-160 MG tablet Take 1 tablet by mouth 2 (two) times daily. 09/09/15 09/16/15  Tilden FossaElizabeth Alger Kerstein, MD    Family History Family History  Problem Relation Age of Onset  . Heart attack Father   . Diabetes Father   . Heart failure Mother   . Pancreatic cancer Mother   . Multiple sclerosis Sister   . Diabetes Maternal Grandmother   . Heart attack Maternal Grandfather   . Diabetes Paternal Grandmother   . Heart attack Paternal Grandfather   . Hypertension Brother   . Cancer - Colon Brother   . Hypertension Maternal Aunt     Social History Social History  Substance Use Topics  . Smoking status: Former Smoker    Types: Cigars    Quit date: 01/16/2006  . Smokeless tobacco: Never Used  . Alcohol use 0.0 oz/week     Comment: occ     Allergies   Review of patient's allergies indicates no known allergies.   Review of Systems Review of Systems  All other systems reviewed and are negative.    Physical Exam Updated Vital Signs BP (!) 117/49 (BP Location: Right Arm)   Pulse 84   Temp 99.5 F (37.5 C) (Oral)   Resp 20   SpO2 100%   Physical Exam  Constitutional: He is oriented to person,  place, and time. He appears well-developed and well-nourished.  HENT:  Head: Normocephalic and atraumatic.  Cardiovascular: Normal rate and regular rhythm.   No murmur heard. Pulmonary/Chest: Effort normal. No respiratory distress.  Decreased air movement to bilateral bases  Abdominal:  Obese abdomen. Mild to moderate tenderness over the right mid and right upper abdomen without any voluntary guarding or rebound. There is a 1 cm wound to the right upper quadrant with pink granulation tissue and a scant amount of white drainage. There is mild to moderate erythema to the right lower pannus without any induration or significant tenderness.  Musculoskeletal: He exhibits no tenderness.  Nonpitting edema to BLE  Neurological: He is alert and oriented to person, place, and time.  Skin: Skin  is warm and dry.  Psychiatric: He has a normal mood and affect. His behavior is normal.  Nursing note and vitals reviewed.    ED Treatments / Results  Labs (all labs ordered are listed, but only abnormal results are displayed) Labs Reviewed  CULTURE, BLOOD (ROUTINE X 2)  CULTURE, BLOOD (ROUTINE X 2)  URINALYSIS, ROUTINE W REFLEX MICROSCOPIC (NOT AT [facility])  I-STAT CG4 LACTIC ACID, ED    EKG  EKG Interpretation None       Radiology Ct Abdomen Pelvis Wo Contrast  Result Date: 09/09/2015 CLINICAL DATA:  Abdominal pain, generalized. Gallbladder surgery August 18, 2015 EXAM: CT ABDOMEN AND PELVIS WITHOUT CONTRAST TECHNIQUE: Multidetector CT imaging of the abdomen and pelvis was performed following the standard protocol without IV contrast. COMPARISON:  06/17/2015 FINDINGS: Lower chest and abdominal wall: Band of subcutaneous strandy density along the right abdomen. There is no suspected abscess. No soft tissue emphysema. Supraumbilical hernia containing fat and a loop of non edematous small bowel. Distended lower esophagus filled with oral contrast. No superimposed inflammation or wall thickening.  Hepatobiliary: No focal liver abnormality.Cholecystectomy. Expected surgical site without evidence of abscess or bile leak. No visible choledocholithiasis. Pancreas: Unremarkable. Spleen: Unremarkable. Adrenals/Urinary Tract: Negative adrenals. No hydronephrosis or stone. Unremarkable bladder. Stomach/Bowel: Postoperative stomach . No inflammation or obstruction. Reproductive:No pathologic findings. Vascular/Lymphatic: Extensive aortic and branch vessel atherosclerotic calcification. No mass or adenopathy. Other: No ascites or pneumoperitoneum. Musculoskeletal: Advanced and diffuse disc and facet degeneration with spurs causing multi-level lumbar canal and foraminal stenosis. IMPRESSION: 1. Nonspecific abdominal wall edema/inflammation along the cholecystectomy incision. No soft tissue emphysema or suspected abscess. 2. Unremarkable postoperative gallbladder fossa. 3. Distended esophagus from gastroesophageal reflux or poor clearance. 4. Supraumbilical small bowel containing hernia. 5.  Aortic Atherosclerosis (ICD10-170.0) Electronically Signed   By: Marnee Spring M.D.   On: 09/09/2015 20:15   Dg Chest 2 View  Result Date: 09/09/2015 CLINICAL DATA:  Right lower quadrant abdominal pain since cholecystectomy, cough, fever EXAM: CHEST  2 VIEW COMPARISON:  CTA chest dated 12/21/2014 FINDINGS: Lungs are clear.  No pleural effusion or pneumothorax. The heart is normal in size. Mild degenerative changes of the visualized thoracolumbar spine. IMPRESSION: Normal chest radiographs. Electronically Signed   By: Charline Bills M.D.   On: 09/09/2015 18:17    Procedures Procedures (including critical care time)  Medications Ordered in ED Medications  sodium chloride 0.9 % bolus 1,000 mL (not administered)     Initial Impression / Assessment and Plan / ED Course  I have reviewed the triage vital signs and the nursing notes.  Pertinent labs & imaging results that were available during my care of the patient  were reviewed by me and considered in my medical decision making (see chart for details).  Clinical Course   Pt here for evaluation of right sided abdominal pain following laparoscopic cholecystectomy three weeks ago.  Labs obtained prior to ED arrival with acute kidney injury, leukocytosis.  CT abd without clear evidence of infection, CXR negative for pneumonia.  Skin exam with mild erythema to the lower abdominal wall remote from incision site, unclear if developing cellulitis or mild erythema.  D/w Dr. Michaell Cowing - recommends admission to Medicine with Surgery consult.  Medicine consulted for admission.     Final Clinical Impressions(s) / ED Diagnoses   Final diagnoses:  None    New Prescriptions New Prescriptions   CEPHALEXIN (KEFLEX) 500 MG CAPSULE    Take 1 capsule (500 mg total) by mouth 4 (four)  times daily.   HYDROCODONE-ACETAMINOPHEN (NORCO/VICODIN) 5-325 MG TABLET    Take 1 tablet by mouth every 6 (six) hours as needed.   SULFAMETHOXAZOLE-TRIMETHOPRIM (BACTRIM DS,SEPTRA DS) 800-160 MG TABLET    Take 1 tablet by mouth 2 (two) times daily.     Tilden Fossa, MD 09/10/15 [phone]

## 2015-09-09 NOTE — ED Triage Notes (Signed)
Pt states he had his gallbladder removed on Aug 2nd by Dr Abbey Chattersosenbower her at [facility]  Pt states he has been having abd pain since and had an outpt CT and was told to come here because he had abnormal results

## 2015-09-10 ENCOUNTER — Inpatient Hospital Stay (HOSPITAL_COMMUNITY): Payer: BLUE CROSS/BLUE SHIELD

## 2015-09-10 DIAGNOSIS — I4891 Unspecified atrial fibrillation: Secondary | ICD-10-CM | POA: Diagnosis present

## 2015-09-10 DIAGNOSIS — L03311 Cellulitis of abdominal wall: Secondary | ICD-10-CM | POA: Diagnosis present

## 2015-09-10 DIAGNOSIS — Z9049 Acquired absence of other specified parts of digestive tract: Secondary | ICD-10-CM | POA: Diagnosis not present

## 2015-09-10 DIAGNOSIS — Z87891 Personal history of nicotine dependence: Secondary | ICD-10-CM | POA: Diagnosis not present

## 2015-09-10 DIAGNOSIS — Y836 Removal of other organ (partial) (total) as the cause of abnormal reaction of the patient, or of later complication, without mention of misadventure at the time of the procedure: Secondary | ICD-10-CM | POA: Diagnosis present

## 2015-09-10 DIAGNOSIS — F419 Anxiety disorder, unspecified: Secondary | ICD-10-CM | POA: Diagnosis present

## 2015-09-10 DIAGNOSIS — E871 Hypo-osmolality and hyponatremia: Secondary | ICD-10-CM | POA: Diagnosis present

## 2015-09-10 DIAGNOSIS — E869 Volume depletion, unspecified: Secondary | ICD-10-CM | POA: Diagnosis present

## 2015-09-10 DIAGNOSIS — I129 Hypertensive chronic kidney disease with stage 1 through stage 4 chronic kidney disease, or unspecified chronic kidney disease: Secondary | ICD-10-CM | POA: Diagnosis present

## 2015-09-10 DIAGNOSIS — I130 Hypertensive heart and chronic kidney disease with heart failure and stage 1 through stage 4 chronic kidney disease, or unspecified chronic kidney disease: Secondary | ICD-10-CM | POA: Diagnosis present

## 2015-09-10 DIAGNOSIS — K219 Gastro-esophageal reflux disease without esophagitis: Secondary | ICD-10-CM | POA: Diagnosis present

## 2015-09-10 DIAGNOSIS — D62 Acute posthemorrhagic anemia: Secondary | ICD-10-CM | POA: Diagnosis present

## 2015-09-10 DIAGNOSIS — Z6841 Body Mass Index (BMI) 40.0 and over, adult: Secondary | ICD-10-CM | POA: Diagnosis not present

## 2015-09-10 DIAGNOSIS — B9561 Methicillin susceptible Staphylococcus aureus infection as the cause of diseases classified elsewhere: Secondary | ICD-10-CM | POA: Diagnosis present

## 2015-09-10 DIAGNOSIS — L02211 Cutaneous abscess of abdominal wall: Secondary | ICD-10-CM | POA: Diagnosis present

## 2015-09-10 DIAGNOSIS — N179 Acute kidney failure, unspecified: Secondary | ICD-10-CM | POA: Diagnosis present

## 2015-09-10 DIAGNOSIS — I2510 Atherosclerotic heart disease of native coronary artery without angina pectoris: Secondary | ICD-10-CM | POA: Diagnosis present

## 2015-09-10 DIAGNOSIS — K432 Incisional hernia without obstruction or gangrene: Secondary | ICD-10-CM | POA: Diagnosis present

## 2015-09-10 DIAGNOSIS — Z800 Family history of malignant neoplasm of digestive organs: Secondary | ICD-10-CM | POA: Diagnosis not present

## 2015-09-10 DIAGNOSIS — Z9884 Bariatric surgery status: Secondary | ICD-10-CM | POA: Diagnosis not present

## 2015-09-10 DIAGNOSIS — R1011 Right upper quadrant pain: Secondary | ICD-10-CM | POA: Diagnosis present

## 2015-09-10 DIAGNOSIS — G4730 Sleep apnea, unspecified: Secondary | ICD-10-CM | POA: Diagnosis present

## 2015-09-10 DIAGNOSIS — F329 Major depressive disorder, single episode, unspecified: Secondary | ICD-10-CM | POA: Diagnosis present

## 2015-09-10 DIAGNOSIS — N183 Chronic kidney disease, stage 3 (moderate): Secondary | ICD-10-CM | POA: Diagnosis present

## 2015-09-10 DIAGNOSIS — T814XXA Infection following a procedure, initial encounter: Secondary | ICD-10-CM | POA: Diagnosis present

## 2015-09-10 LAB — SODIUM, URINE, RANDOM: Sodium, Ur: 11 mmol/L

## 2015-09-10 LAB — BASIC METABOLIC PANEL
ANION GAP: 10 (ref 5–15)
BUN: 39 mg/dL — ABNORMAL HIGH (ref 6–20)
CALCIUM: 8.1 mg/dL — AB (ref 8.9–10.3)
CHLORIDE: 99 mmol/L — AB (ref 101–111)
CO2: 25 mmol/L (ref 22–32)
CREATININE: 2.66 mg/dL — AB (ref 0.61–1.24)
GFR, EST AFRICAN AMERICAN: 28 mL/min — AB (ref >60)
GFR, EST NON AFRICAN AMERICAN: 24 mL/min — AB (ref >60)
Glucose, Bld: 110 mg/dL — ABNORMAL HIGH (ref 65–99)
POTASSIUM: 3.7 mmol/L (ref 3.5–5.1)
SODIUM: 134 mmol/L — AB (ref 135–145)

## 2015-09-10 LAB — URINALYSIS, ROUTINE W REFLEX MICROSCOPIC
GLUCOSE, UA: NEGATIVE mg/dL
Hgb urine dipstick: NEGATIVE
Ketones, ur: NEGATIVE mg/dL
LEUKOCYTES UA: NEGATIVE
NITRITE: NEGATIVE
PH: 5 (ref 5.0–8.0)
Protein, ur: NEGATIVE mg/dL
Specific Gravity, Urine: 1.02 (ref 1.005–1.030)

## 2015-09-10 LAB — IRON AND TIBC
Iron: 10 ug/dL — ABNORMAL LOW (ref 45–182)
Saturation Ratios: 4 % — ABNORMAL LOW (ref 17.9–39.5)
TIBC: 232 ug/dL — ABNORMAL LOW (ref 250–450)
UIBC: 222 ug/dL

## 2015-09-10 LAB — CBC
HEMATOCRIT: 23.8 % — AB (ref 39.0–52.0)
HEMOGLOBIN: 7.7 g/dL — AB (ref 13.0–17.0)
MCH: 29.7 pg (ref 26.0–34.0)
MCHC: 32.4 g/dL (ref 30.0–36.0)
MCV: 91.9 fL (ref 78.0–100.0)
Platelets: 219 10*3/uL (ref 150–400)
RBC: 2.59 MIL/uL — AB (ref 4.22–5.81)
RDW: 15.3 % (ref 11.5–15.5)
WBC: 13.9 10*3/uL — ABNORMAL HIGH (ref 4.0–10.5)

## 2015-09-10 LAB — HEPATIC FUNCTION PANEL
ALT: 24 U/L (ref 17–63)
AST: 34 U/L (ref 15–41)
Albumin: 2.7 g/dL — ABNORMAL LOW (ref 3.5–5.0)
Alkaline Phosphatase: 66 U/L (ref 38–126)
Bilirubin, Direct: 0.2 mg/dL (ref 0.1–0.5)
Indirect Bilirubin: 0.5 mg/dL (ref 0.3–0.9)
Total Bilirubin: 0.7 mg/dL (ref 0.3–1.2)
Total Protein: 6.3 g/dL — ABNORMAL LOW (ref 6.5–8.1)

## 2015-09-10 LAB — FERRITIN: Ferritin: 117 ng/mL (ref 24–336)

## 2015-09-10 LAB — CREATININE, URINE, RANDOM: Creatinine, Urine: 333.64 mg/dL

## 2015-09-10 LAB — CG4 I-STAT (LACTIC ACID): LACTIC ACID, VENOUS: 1.26 mmol/L (ref 0.5–1.9)

## 2015-09-10 LAB — PROTEIN, URINE, RANDOM: Total Protein, Urine: 25 mg/dL

## 2015-09-10 MED ORDER — DEXTROSE 5 % IV SOLN
2.0000 g | INTRAVENOUS | Status: DC
  Administered 2015-09-10 – 2015-09-14 (×5): 2 g via INTRAVENOUS
  Filled 2015-09-10 (×5): qty 2

## 2015-09-10 MED ORDER — CEFAZOLIN IN D5W 1 GM/50ML IV SOLN: 1.0000 g | Freq: Once | INTRAVENOUS | Status: DC

## 2015-09-10 MED ORDER — DEXTROSE 5 % IV SOLN: 1.0000 g | INTRAVENOUS | Status: DC

## 2015-09-10 MED ORDER — ENSURE ENLIVE PO LIQD
237.0000 mL | Freq: Two times a day (BID) | ORAL | Status: DC
  Administered 2015-09-10 – 2015-09-14 (×7): 237 mL via ORAL

## 2015-09-10 MED ORDER — ENOXAPARIN SODIUM 100 MG/ML ~~LOC~~ SOLN
90.0000 mg | Freq: Every day | SUBCUTANEOUS | Status: DC
Start: 2015-09-10 — End: 2015-09-14
  Administered 2015-09-10 – 2015-09-14 (×5): 90 mg via SUBCUTANEOUS
  Filled 2015-09-10 (×5): qty 1

## 2015-09-10 MED ORDER — ENOXAPARIN SODIUM 40 MG/0.4ML ~~LOC~~ SOLN: 40.0000 mg | SUBCUTANEOUS | Status: DC

## 2015-09-10 NOTE — Progress Notes (Signed)
Rx Brief Lovenox note:   Wt=184 kg, CrCl~33 ml/min, BMI~58  Rx adjusted Lovenox to 90 mg daily in pt with BMI >30  Thanks Lorenza EvangelistGreen, Ezell Poke R 09/10/2015 3:35 AM

## 2015-09-10 NOTE — Progress Notes (Signed)
PROGRESS NOTE    PRANSHU LYSTER  WUJ:[id number] DOB: May 30, 1950 DOA: 09/09/2015 PCP: [name], MD  Outpatient Specialists:   Brief Narrative: 65 y.o. male with medical history significant of Lap chole done 3 weeks ago, bariatric surgery, morbid obesity.  Patient presents to the ED with c/o 1 week history of RUQ pain.  This is dull and constant in nature.  Classically worsening with movement.  He reports low-grade fever, decreased appetite.  His RUQ is tender and he says it is "hard" when he pushes on it.  He notes that he has erythema of this area surrounding a site where a drain was previously in.  ED Course: CT abd pelvis shows cellulitis of abdominal wall surrounding drain site (no gas no cellulitis), umbilical hernia, GERD, and prior gastric bypass surgery.  Assessment & Plan:   Principal Problem:   Abdominal wall cellulitis Active Problems:   CKD (chronic kidney disease) stage 3, GFR 30-59 ml/min  1. Abdominal wall cellulitis - with fever, leukocytosis. Cellulitis is improving. Continue antibiotics. 2. ARF, likely multifactorial - Volume depletion (likely decreased effective circulatory volume) and Medication (ACEI, Lasix, HCTZ and Meloxican) - Check urine sodium, urine creatinine, urine protein, renal ultrasoun, ANA, C3, C4. IVF. Check albumin. 3. Morbid Obesity 4. Hypertension - Optimize. Keep MAP greater than 65 5. CAD, stable.    DVT prophylaxis: Lovenox Code Status: Full Family Communication:   Consultants:   See H and P  Procedures:   None  Antimicrobials:   IV Rocephin    Subjective: Nil complaints. Cellulitis is improving.  Objective: Vitals:   09/10/15 0041 09/10/15 0221 09/10/15 0331 09/10/15 0603  BP: (!) 111/52 (!) 95/50 (!) 104/43 (!) 108/44  Pulse: 81 82 81 77  Resp: 16 15 16 16   Temp:   99.6 F (37.6 C) 98.6 F (37 C)  TempSrc:   Oral Oral  SpO2: 98% 98% 98% 97%  Weight:   (!) 182.8 kg (403 lb)   Height:   5\' 9"  (1.753  m)     Intake/Output Summary (Last 24 hours) at 09/10/15 1034 Last data filed at 09/10/15 1003  Gross per 24 hour  Intake           446.25 ml  Output              200 ml  Net           246.25 ml   Filed Weights   09/10/15 0331  Weight: (!) 182.8 kg (403 lb)    Examination:  General exam: Morbidly obese. Appears calm and comfortable  Respiratory system: Clear to auscultation.   Cardiovascular system: S1 & S2. Gastrointestinal system: Abdomen is morbidly obese, indurated area around RUQ. Some skin changes but no obvious redness (seems to have improved a lot). Organs are difficult to assess.  Central nervous system: Alert and oriented. Moves all limbs. Extremities: Fullness of the legs.    Data Reviewed: I have personally reviewed following labs and imaging studies  CBC:  Recent Labs Lab 09/09/15 1447 09/10/15 0419  WBC 17.4* 13.9*  NEUTROABS 15.4*  --   HGB 9.1* 7.7*  HCT 28.7* 23.8*  MCV 92.6 91.9  PLT 258 219   Basic Metabolic Panel:  Recent Labs Lab 09/09/15 1447 09/10/15 0419  NA 134* 134*  K 3.7 3.7  CL 98* 99*  CO2 24 25  GLUCOSE 112* 110*  BUN 28* 39*  CREATININE 2.28* 2.66*  CALCIUM 8.7* 8.1*   GFR: Estimated Creatinine Clearance: 46.4  mL/min (by C-G formula based on SCr of 2.66 mg/dL). Liver Function Tests:  Recent Labs Lab 09/09/15 1447  AST 30  ALT 20  ALKPHOS 77  BILITOT 0.8  PROT 6.8  ALBUMIN 3.0*   No results for input(s): LIPASE, AMYLASE in the last 168 hours. No results for input(s): AMMONIA in the last 168 hours. Coagulation Profile: No results for input(s): INR, PROTIME in the last 168 hours. Cardiac Enzymes: No results for input(s): CKTOTAL, CKMB, CKMBINDEX, TROPONINI in the last 168 hours. BNP (last 3 results) No results for input(s): PROBNP in the last 8760 hours. HbA1C: No results for input(s): HGBA1C in the last 72 hours. CBG: No results for input(s): GLUCAP in the last 168 hours. Lipid Profile: No results for  input(s): CHOL, HDL, LDLCALC, TRIG, CHOLHDL, LDLDIRECT in the last 72 hours. Thyroid Function Tests: No results for input(s): TSH, T4TOTAL, FREET4, T3FREE, THYROIDAB in the last 72 hours. Anemia Panel: No results for input(s): VITAMINB12, FOLATE, FERRITIN, TIBC, IRON, RETICCTPCT in the last 72 hours. Urine analysis:    Component Value Date/Time   COLORURINE AMBER (A) 09/09/2015 2216   APPEARANCEUR CLOUDY (A) 09/09/2015 2216   LABSPEC 1.020 09/09/2015 2216   PHURINE 5.0 09/09/2015 2216   GLUCOSEU NEGATIVE 09/09/2015 2216   HGBUR NEGATIVE 09/09/2015 2216   BILIRUBINUR SMALL (A) 09/09/2015 2216   KETONESUR NEGATIVE 09/09/2015 2216   PROTEINUR NEGATIVE 09/09/2015 2216   NITRITE NEGATIVE 09/09/2015 2216   LEUKOCYTESUR NEGATIVE 09/09/2015 2216   Sepsis Labs: @LABRCNTIP (procalcitonin:4,lacticidven:4)  )No results found for this or any previous visit (from the past 240 hour(s)).       Radiology Studies: Ct Abdomen Pelvis Wo Contrast  Result Date: 09/09/2015 CLINICAL DATA:  Abdominal pain, generalized. Gallbladder surgery August 18, 2015 EXAM: CT ABDOMEN AND PELVIS WITHOUT CONTRAST TECHNIQUE: Multidetector CT imaging of the abdomen and pelvis was performed following the standard protocol without IV contrast. COMPARISON:  06/17/2015 FINDINGS: Lower chest and abdominal wall: Band of subcutaneous strandy density along the right abdomen. There is no suspected abscess. No soft tissue emphysema. Supraumbilical hernia containing fat and a loop of non edematous small bowel. Distended lower esophagus filled with oral contrast. No superimposed inflammation or wall thickening. Hepatobiliary: No focal liver abnormality.Cholecystectomy. Expected surgical site without evidence of abscess or bile leak. No visible choledocholithiasis. Pancreas: Unremarkable. Spleen: Unremarkable. Adrenals/Urinary Tract: Negative adrenals. No hydronephrosis or stone. Unremarkable bladder. Stomach/Bowel: Postoperative stomach .  No inflammation or obstruction. Reproductive:No pathologic findings. Vascular/Lymphatic: Extensive aortic and branch vessel atherosclerotic calcification. No mass or adenopathy. Other: No ascites or pneumoperitoneum. Musculoskeletal: Advanced and diffuse disc and facet degeneration with spurs causing multi-level lumbar canal and foraminal stenosis. IMPRESSION: 1. Nonspecific abdominal wall edema/inflammation along the cholecystectomy incision. No soft tissue emphysema or suspected abscess. 2. Unremarkable postoperative gallbladder fossa. 3. Distended esophagus from gastroesophageal reflux or poor clearance. 4. Supraumbilical small bowel containing hernia. 5.  Aortic Atherosclerosis (ICD10-170.0) Electronically Signed   By: Marnee Spring M.D.   On: 09/09/2015 20:15   Dg Chest 2 View  Result Date: 09/09/2015 CLINICAL DATA:  Right lower quadrant abdominal pain since cholecystectomy, cough, fever EXAM: CHEST  2 VIEW COMPARISON:  CTA chest dated 12/21/2014 FINDINGS: Lungs are clear.  No pleural effusion or pneumothorax. The heart is normal in size. Mild degenerative changes of the visualized thoracolumbar spine. IMPRESSION: Normal chest radiographs. Electronically Signed   By: Charline Bills M.D.   On: 09/09/2015 18:17        Scheduled Meds: . acetaminophen  1,000  mg Oral TID  . aspirin EC  81 mg Oral Daily  . atorvastatin  40 mg Oral Daily  . buPROPion  300 mg Oral Daily  . cefTRIAXone (ROCEPHIN)  IV  2 g Intravenous Q24H  . enoxaparin (LOVENOX) injection  90 mg Subcutaneous Daily  . feeding supplement (ENSURE ENLIVE)  237 mL Oral BID BM  . lip balm  1 application Topical BID  . multivitamin with minerals  1 tablet Oral Daily  . pantoprazole  80 mg Oral Daily  . vitamin B-12  1,000 mcg Oral Daily  . vitamin C  500 mg Oral BID   Continuous Infusions: . sodium chloride 75 mL/hr at 09/10/15 0355     LOS: 0 days    Time spent: Greater than 30 Minutes    Berton MountSylvester Rebekkah Powless, MD  [facility] Pager #: [phone] 7PM-7AM contact night coverage as above

## 2015-09-10 NOTE — Progress Notes (Signed)
Subjective: Still having RUQ pain.  CXR, CT reviewed.  Objective: Vital signs in last 24 hours: Temp:  [98.6 F (37 C)-99.6 F (37.6 C)] 98.6 F (37 C) (08/25 0603) Pulse Rate:  [77-84] 77 (08/25 0603) Resp:  [15-20] 16 (08/25 0603) BP: (95-117)/(43-52) 108/44 (08/25 0603) SpO2:  [97 %-100 %] 97 % (08/25 0603) Weight:  [182.8 kg (403 lb)] 182.8 kg (403 lb) (08/25 0331) Last BM Date: 09/09/15  Intake/Output from previous day: 08/24 0701 - 08/25 0700 In: 326.3 [P.O.:120; I.V.:156.3; IV Piggyback:50] Out: 0  Intake/Output this shift: No intake/output data recorded.  PE: General- In NAD Abdomen-soft, dull red color now around lateral open wound with firmness deep to this, and a small amount of yellow drainage with no odor  Lab Results:   Recent Labs  09/09/15 1447 09/10/15 0419  WBC 17.4* 13.9*  HGB 9.1* 7.7*  HCT 28.7* 23.8*  PLT 258 219   BMET  Recent Labs  09/09/15 1447 09/10/15 0419  NA 134* 134*  K 3.7 3.7  CL 98* 99*  CO2 24 25  GLUCOSE 112* 110*  BUN 28* 39*  CREATININE 2.28* 2.66*  CALCIUM 8.7* 8.1*   PT/INR No results for input(s): LABPROT, INR in the last 72 hours. Comprehensive Metabolic Panel:    Component Value Date/Time   NA 134 (L) 09/10/2015 0419   NA 134 (L) 09/09/2015 1447   K 3.7 09/10/2015 0419   K 3.7 09/09/2015 1447   CL 99 (L) 09/10/2015 0419   CL 98 (L) 09/09/2015 1447   CO2 25 09/10/2015 0419   CO2 24 09/09/2015 1447   BUN 39 (H) 09/10/2015 0419   BUN 28 (H) 09/09/2015 1447   CREATININE 2.66 (H) 09/10/2015 0419   CREATININE 2.28 (H) 09/09/2015 1447   GLUCOSE 110 (H) 09/10/2015 0419   GLUCOSE 112 (H) 09/09/2015 1447   CALCIUM 8.1 (L) 09/10/2015 0419   CALCIUM 8.7 (L) 09/09/2015 1447   AST 30 09/09/2015 1447   AST 26 08/12/2015 1440   ALT 20 09/09/2015 1447   ALT 16 (L) 08/12/2015 1440   ALKPHOS 77 09/09/2015 1447   ALKPHOS 71 08/12/2015 1440   BILITOT 0.8 09/09/2015 1447   BILITOT 0.7 08/12/2015 1440   PROT 6.8  09/09/2015 1447   PROT 7.8 08/12/2015 1440   ALBUMIN 3.0 (L) 09/09/2015 1447   ALBUMIN 4.3 08/12/2015 1440     Studies/Results: Ct Abdomen Pelvis Wo Contrast  Result Date: 09/09/2015 CLINICAL DATA:  Abdominal pain, generalized. Gallbladder surgery August 18, 2015 EXAM: CT ABDOMEN AND PELVIS WITHOUT CONTRAST TECHNIQUE: Multidetector CT imaging of the abdomen and pelvis was performed following the standard protocol without IV contrast. COMPARISON:  06/17/2015 FINDINGS: Lower chest and abdominal wall: Band of subcutaneous strandy density along the right abdomen. There is no suspected abscess. No soft tissue emphysema. Supraumbilical hernia containing fat and a loop of non edematous small bowel. Distended lower esophagus filled with oral contrast. No superimposed inflammation or wall thickening. Hepatobiliary: No focal liver abnormality.Cholecystectomy. Expected surgical site without evidence of abscess or bile leak. No visible choledocholithiasis. Pancreas: Unremarkable. Spleen: Unremarkable. Adrenals/Urinary Tract: Negative adrenals. No hydronephrosis or stone. Unremarkable bladder. Stomach/Bowel: Postoperative stomach . No inflammation or obstruction. Reproductive:No pathologic findings. Vascular/Lymphatic: Extensive aortic and branch vessel atherosclerotic calcification. No mass or adenopathy. Other: No ascites or pneumoperitoneum. Musculoskeletal: Advanced and diffuse disc and facet degeneration with spurs causing multi-level lumbar canal and foraminal stenosis. IMPRESSION: 1. Nonspecific abdominal wall edema/inflammation along the cholecystectomy incision. No soft tissue emphysema  or suspected abscess. 2. Unremarkable postoperative gallbladder fossa. 3. Distended esophagus from gastroesophageal reflux or poor clearance. 4. Supraumbilical small bowel containing hernia. 5.  Aortic Atherosclerosis (ICD10-170.0) Electronically Signed   By: Marnee SpringJonathon  Watts M.D.   On: 09/09/2015 20:15   Dg Chest 2  View  Result Date: 09/09/2015 CLINICAL DATA:  Right lower quadrant abdominal pain since cholecystectomy, cough, fever EXAM: CHEST  2 VIEW COMPARISON:  CTA chest dated 12/21/2014 FINDINGS: Lungs are clear.  No pleural effusion or pneumothorax. The heart is normal in size. Mild degenerative changes of the visualized thoracolumbar spine. IMPRESSION: Normal chest radiographs. Electronically Signed   By: Charline BillsSriyesh  Krishnan M.D.   On: 09/09/2015 18:17    Anti-infectives: Anti-infectives    Start     Dose/Rate Route Frequency Ordered Stop   09/10/15 0400  cefTRIAXone (ROCEPHIN) 2 g in dextrose 5 % 50 mL IVPB     2 g 100 mL/hr over 30 Minutes Intravenous Every 24 hours 09/10/15 0257     09/10/15 0230  cefTRIAXone (ROCEPHIN) 1 g in dextrose 5 % 50 mL IVPB  Status:  Discontinued     1 g 100 mL/hr over 30 Minutes Intravenous Every 24 hours 09/10/15 0220 09/10/15 0257   09/10/15 0045  ceFAZolin (ANCEF) IVPB 1 g/50 mL premix  Status:  Discontinued     1 g 100 mL/hr over 30 Minutes Intravenous  Once 09/10/15 0034 09/10/15 0220   09/10/15 0030  ceFAZolin (ANCEF) 3 g in dextrose 5 % 50 mL IVPB  Status:  Discontinued     3 g 130 mL/hr over 30 Minutes Intravenous Every 8 hours 09/09/15 2355 09/10/15 0220   09/10/15 0000  metroNIDAZOLE (FLAGYL) IVPB 500 mg  Status:  Discontinued     500 mg 100 mL/hr over 60 Minutes Intravenous Every 6 hours 09/09/15 2355 09/10/15 0259   09/09/15 2230  cephALEXin (KEFLEX) capsule 500 mg  Status:  Discontinued     500 mg Oral  Once 09/09/15 2216 09/09/15 2223   09/09/15 2230  sulfamethoxazole-trimethoprim (BACTRIM DS,SEPTRA DS) 800-160 MG per tablet 1 tablet  Status:  Discontinued     1 tablet Oral  Once 09/09/15 2216 09/09/15 2223   09/09/15 0000  cephALEXin (KEFLEX) 500 MG capsule  Status:  Discontinued     500 mg Oral 4 times daily 09/09/15 2218 09/10/15    09/09/15 0000  sulfamethoxazole-trimethoprim (BACTRIM DS,SEPTRA DS) 800-160 MG tablet  Status:  Discontinued     1  tablet Oral 2 times daily 09/09/15 2218 09/10/15       Assessment Principal Problem:   Abdominal wall cellulitis-on abxs; WBC improved. Active Problems:   Acute on CKD (chronic kidney disease) stage 3, GFR 30-59 ml/min-worsening renal function   Anemia-hemoglobin down this AM.    LOS: 0 days   Plan: Continue abxs from our standpoint.  Worsening kidney function is concerning.   Gary [name] Shela CommonsJ 09/10/2015

## 2015-09-10 NOTE — ED Notes (Signed)
Hospitalist at bedside 

## 2015-09-10 NOTE — Progress Notes (Signed)
OFFICE VISIT BY dr Irven Shellingrosenbower  Gary [name] 09/09/2015 3:56 PM Location: [facility] Patient #: [id number] DOB: December 11, 1950 Single / Language: [place] / Race: White Male   History of Present Illness Gary [name](Jerrik Housholder J. Dontavis Tschantz MD; 09/09/2015 4:16 PM) Patient words: post op, lap chol.  The patient is a 65 year old male.  Note:He is status post laparoscopic cholecystectomy August 18, 2015. About 9 days ago, he started developing some mild right upper quadrant pain that has progressively worsened over time and is now associated with subjective fever, chills, nausea, and vomiting (with solid fluid). He was sent for complete metabolic profile which demonstrated no elevation of liver function tests but his creatinine is elevated (has chronic kidney disease). White blood cell count was 17,000. Hemoglobin was 9.1. Hemoglobin was 10.2 preop. White blood cell count was 6300 preop. He is still wearing a bandage over the drain site. It is still draining some yellowish fluid.  PMH: Arthritis Atrial Fibrillation Back Pain Chest pain Cholelithiasis Congestive Heart Failure Depression Gastroesophageal Reflux Disease High blood pressure Sleep Apnea Vascular Disease CAD Morbid Obesity  Allergies (April Staton, CMA; 09/09/2015 3:57 PM) No Known Drug Allergies06/21/2017  Medication History (April Staton, CMA; 09/09/2015 3:57 PM) Lisinopril-Hydrochlorothiazide (10-12.5MG  Tablet, Oral) Active. Furosemide (20MG  Tablet, Oral) Active. Meloxicam (15MG  Tablet, Oral) Active. TraMADol HCl (50MG  Tablet, Oral) Active. ROPINIRole HCl (0.5MG  Tablet, Oral) Active. Atorvastatin Calcium (40MG  Tablet, Oral) Active. Ferrous Sulfate (220 (44 Fe)MG/5ML Elixir, Oral) Active. NexIUM (20MG  Capsule DR, Oral) Active. Multi Vitamin/Minerals (Oral) Active. Multi Vitamin (Oral) Active. Medications Reconciled  Vitals (April Staton CMA; 09/09/2015 3:57 PM) 09/09/2015 3:57 PM Weight: 403.13 lb  Height: 69in Height was reported by patient. Body Surface Area: 2.78 m Body Mass Index: 59.53 kg/m  Temp.: 99.30F(Oral)  Pulse: 107 (Regular)  P.OX: 97% (Room air) BP: 120/64 (Sitting, Left Arm, Standard)       Physical Exam Gary [name](Kashira Behunin J. Lakeisha Waldrop MD; 09/09/2015 4:17 PM) The physical exam findings are as follows: Note:General-slightly ill-appearing morbidly obese male with a temperature of 99.9.  Eyes-no icterus  Cardiovascular rate regular rate and rhythm  Lungs-clear to auscultation  Abdomen soft, periumbilical wound is clean and intact. Lateral right-sided wound is open with some yellowish drainage. There is tenderness superior to this.    Assessment & Plan Gary [name](Destaney Sarkis J. Sammie Schermerhorn MD; 09/09/2015 4:20 PM) POSTOPERATIVE RIGHT UPPER QUADRANT ABDOMINAL PAIN (G89.18) Impression: Etiology is most likely a postoperative infection given the leukocytosis and fever.  Plan: [facility] chest x-ray and a CT scan. We'll have those results reviewed before he leaves the hospital and there are signs of infection he may need to be admitted for hydration and IV antibiotics. This has been discussed with him.  Addendum:  He was admitted and started on IV abxs for possible abdominal wall cellulitis.  He has AKI which is slightly worse this AM.     Signed by Gary Pollackodd J Naquita Nappier, MD (09/09/2015 4:20 PM)

## 2015-09-10 NOTE — Progress Notes (Signed)
Nutrition Brief Note  Patient identified on the Malnutrition Screening Tool (MST) Report  Wt Readings from Last 15 Encounters:  09/10/15 (!) 403 lb (182.8 kg)  08/18/15 (!) 406 lb (184.2 kg)  11/26/14 (!) 406 lb (184.2 kg)  11/10/14 (!) 409 lb (185.5 kg)  09/23/14 (!) 409 lb (185.5 kg)  09/14/14 (!) 410 lb 1.9 oz (186 kg)   Spoke with [name] at bedside. He endorses poor app x7 days PTA, recently s/p lap chole, and had not adjusted his home diet to low fat to compensate, was having issues with abdominal pain, seems to have zapped his appetite. He is s/p gastric bypass Today his PO has still been low, 5-6 spoonfuls of egg whites and potatoes. He also had very little at lunch. Pt's wt has been relatively stable, down 2 kgs in the past month, but patient states he has been trying to lose weight. No ONS needed.  Body mass index is 59.51 kg/m. Patient meets criteria for obese class III based on current BMI.   Current diet order is heart healthy, patient is consuming approximately 20% of meals at this time. Labs and medications reviewed.   No nutrition interventions warranted at this time. If nutrition issues arise, please consult RD.   Gary AnoWilliam M. Genette Huertas, MS, RD LDN Inpatient Clinical Dietitian Pager [phone]

## 2015-09-10 NOTE — H&P (Addendum)
History and Physical    Gary ChildesBobby R [name] UJW:[id number] DOB: 1950/04/12 DOA: 09/09/2015   PCP: Londell MohPHARR,[name] DAVIDSON, MD Chief Complaint:  Chief Complaint  Patient presents with  . Abdominal Pain    HPI: Gary [name] is a 65 y.o. male with medical history significant of Lap chole done 3 weeks ago, bariatric surgery, morbid obesity.  Patient presents to the ED with c/o 1 week history of RUQ pain.  This is dull and constant in nature.  Classically worsening with movement.  He reports low-grade fever, decreased appetite.  His RUQ is tender and he says it is "hard" when he pushes on it.  He notes that he has erythema of this area surrounding a site where a drain was previously in.  ED Course: CT abd pelvis shows cellulitis of abdominal wall surrounding drain site (no gas no cellulitis), umbilical hernia, GERD, and prior gastric bypass surgery.  Review of Systems: As per HPI otherwise 10 point review of systems negative.    Past Medical History:  Diagnosis Date  . Anxiety   . CAD (coronary artery disease), native coronary artery 09/07/2014   3 vessel coronary calcification noted on CT scan 2016   . Depression   . Diastolic dysfunction   . Dysrhythmia    hX A-Fib  . GERD (gastroesophageal reflux disease)   . History of hiatal hernia   . Hypertension   . Hypertensive heart disease without CHF   . Morbid obesity (HCC)   . Sleep apnea    does not use or have a cpap-he is supposed to use one    Past Surgical History:  Procedure Laterality Date  . CARDIAC CATHETERIZATION N/A 09/23/2014   Procedure: Left Heart Cath and Coronary Angiography;  Surgeon: Kathleene Hazelhristopher D McAlhany, MD;  [facility];  Service: Cardiovascular;  Laterality: N/A;  . CHOLECYSTECTOMY N/A 08/18/2015   Procedure: LAPAROSCOPIC CHOLECYSTECTOMY;  Surgeon: Avel Peaceodd Rosenbower, MD;  Location: [facility] ORS;  Service: General;  Laterality: N/A;  . COLONOSCOPY    . GASTRIC BYPASS  1982  . MOUTH SURGERY    . UPPER GI  ENDOSCOPY       reports that he quit smoking about 9 years ago. His smoking use included Cigars. He has never used smokeless tobacco. He reports that he drinks alcohol. He reports that he does not use drugs.  No Known Allergies  Family History  Problem Relation Age of Onset  . Heart attack Father   . Diabetes Father   . Heart failure Mother   . Pancreatic cancer Mother   . Multiple sclerosis Sister   . Diabetes Maternal Grandmother   . Heart attack Maternal Grandfather   . Diabetes Paternal Grandmother   . Heart attack Paternal Grandfather   . Hypertension Brother   . Cancer - Colon Brother   . Hypertension Maternal Aunt       Prior to Admission medications   Medication Sig Start Date End Date Taking? Authorizing Provider  aspirin EC 81 MG tablet Take 81 mg by mouth daily.   Yes Historical Provider, MD  atorvastatin (LIPITOR) 40 MG tablet Take 40 mg by mouth daily.   Yes Historical Provider, MD  buPROPion (WELLBUTRIN XL) 300 MG 24 hr tablet Take 300 mg by mouth daily.   Yes Historical Provider, MD  furosemide (LASIX) 40 MG tablet Take 40 mg by mouth daily.   Yes Historical Provider, MD  lisinopril-hydrochlorothiazide (PRINZIDE,ZESTORETIC) 20-25 MG per tablet Take 1 tablet by mouth daily.   Yes  Historical Provider, MD  meloxicam (MOBIC) 15 MG tablet Take 15 mg by mouth daily.   Yes Historical Provider, MD  Multiple Vitamin (MULTIVITAMIN WITH MINERALS) TABS tablet Take 1 tablet by mouth daily.   Yes Historical Provider, MD  omeprazole (PRILOSEC) 20 MG capsule Take 20 mg by mouth daily.   Yes Historical Provider, MD  oxyCODONE-acetaminophen (PERCOCET/ROXICET) 5-325 MG tablet Take 1-2 tablets by mouth every 4 (four) hours as needed for severe pain.   Yes Historical Provider, MD  polyethylene glycol (MIRALAX / GLYCOLAX) packet Take 17 g by mouth daily.   Yes Historical Provider, MD  rOPINIRole (REQUIP) 0.5 MG tablet Take 0.5 mg by mouth 3 (three) times daily as needed (restless legs).     Yes Historical Provider, MD  traMADol (ULTRAM) 50 MG tablet Take 50 mg by mouth every 6 (six) hours as needed for moderate pain.    Yes Historical Provider, MD  vitamin B-12 (CYANOCOBALAMIN) 1000 MCG tablet Take 1,000 mcg by mouth daily.   Yes Historical Provider, MD    Physical Exam: Vitals:   09/09/15 2141 09/10/15 0041 09/10/15 0221  BP: (!) 117/49 (!) 111/52 (!) 95/50  Pulse: 84 81 82  Resp: 20 16 15   Temp: 99.5 F (37.5 C)    TempSrc: Oral    SpO2: 100% 98% 98%      Constitutional: NAD, calm, comfortable Eyes: PERRL, lids and conjunctivae normal ENMT: Mucous membranes are moist. Posterior pharynx clear of any exudate or lesions.Normal dentition.  Neck: normal, supple, no masses, no thyromegaly Respiratory: clear to auscultation bilaterally, no wheezing, no crackles. Normal respiratory effort. No accessory muscle use.  Cardiovascular: Regular rate and rhythm, no murmurs / rubs / gallops. No extremity edema. 2+ pedal pulses. No carotid bruits.  Abdomen: no tenderness, no masses palpated. No hepatosplenomegaly. Bowel sounds positive.  Musculoskeletal: no clubbing / cyanosis. No joint deformity upper and lower extremities. Good ROM, no contractures. Normal muscle tone.  Skin: The patient has induration, mild tenderness, mild erythema, surrounding a drain site that has a small amount of yellowish discharge on to a band aid which is overlying it. Neurologic: CN 2-12 grossly intact. Sensation intact, DTR normal. Strength 5/5 in all 4.  Psychiatric: Normal judgment and insight. Alert and oriented x 3. Normal mood.    Labs on Admission: I have personally reviewed following labs and imaging studies  CBC:  Recent Labs Lab 09/09/15 1447  WBC 17.4*  NEUTROABS 15.4*  HGB 9.1*  HCT 28.7*  MCV 92.6  PLT 258   Basic Metabolic Panel:  Recent Labs Lab 09/09/15 1447  NA 134*  K 3.7  CL 98*  CO2 24  GLUCOSE 112*  BUN 28*  CREATININE 2.28*  CALCIUM 8.7*   GFR: CrCl  cannot be calculated (Unknown ideal weight.). Liver Function Tests:  Recent Labs Lab 09/09/15 1447  AST 30  ALT 20  ALKPHOS 77  BILITOT 0.8  PROT 6.8  ALBUMIN 3.0*   No results for input(s): LIPASE, AMYLASE in the last 168 hours. No results for input(s): AMMONIA in the last 168 hours. Coagulation Profile: No results for input(s): INR, PROTIME in the last 168 hours. Cardiac Enzymes: No results for input(s): CKTOTAL, CKMB, CKMBINDEX, TROPONINI in the last 168 hours. BNP (last 3 results) No results for input(s): PROBNP in the last 8760 hours. HbA1C: No results for input(s): HGBA1C in the last 72 hours. CBG: No results for input(s): GLUCAP in the last 168 hours. Lipid Profile: No results for input(s): CHOL, HDL,  LDLCALC, TRIG, CHOLHDL, LDLDIRECT in the last 72 hours. Thyroid Function Tests: No results for input(s): TSH, T4TOTAL, FREET4, T3FREE, THYROIDAB in the last 72 hours. Anemia Panel: No results for input(s): VITAMINB12, FOLATE, FERRITIN, TIBC, IRON, RETICCTPCT in the last 72 hours. Urine analysis: No results found for: COLORURINE, APPEARANCEUR, LABSPEC, PHURINE, GLUCOSEU, HGBUR, BILIRUBINUR, KETONESUR, PROTEINUR, UROBILINOGEN, NITRITE, LEUKOCYTESUR Sepsis Labs: @LABRCNTIP (procalcitonin:4,lacticidven:4) )No results found for this or any previous visit (from the past 240 hour(s)).   Radiological Exams on Admission: Ct Abdomen Pelvis Wo Contrast  Result Date: 09/09/2015 CLINICAL DATA:  Abdominal pain, generalized. Gallbladder surgery August 18, 2015 EXAM: CT ABDOMEN AND PELVIS WITHOUT CONTRAST TECHNIQUE: Multidetector CT imaging of the abdomen and pelvis was performed following the standard protocol without IV contrast. COMPARISON:  06/17/2015 FINDINGS: Lower chest and abdominal wall: Band of subcutaneous strandy density along the right abdomen. There is no suspected abscess. No soft tissue emphysema. Supraumbilical hernia containing fat and a loop of non edematous small bowel.  Distended lower esophagus filled with oral contrast. No superimposed inflammation or wall thickening. Hepatobiliary: No focal liver abnormality.Cholecystectomy. Expected surgical site without evidence of abscess or bile leak. No visible choledocholithiasis. Pancreas: Unremarkable. Spleen: Unremarkable. Adrenals/Urinary Tract: Negative adrenals. No hydronephrosis or stone. Unremarkable bladder. Stomach/Bowel: Postoperative stomach . No inflammation or obstruction. Reproductive:No pathologic findings. Vascular/Lymphatic: Extensive aortic and branch vessel atherosclerotic calcification. No mass or adenopathy. Other: No ascites or pneumoperitoneum. Musculoskeletal: Advanced and diffuse disc and facet degeneration with spurs causing multi-level lumbar canal and foraminal stenosis. IMPRESSION: 1. Nonspecific abdominal wall edema/inflammation along the cholecystectomy incision. No soft tissue emphysema or suspected abscess. 2. Unremarkable postoperative gallbladder fossa. 3. Distended esophagus from gastroesophageal reflux or poor clearance. 4. Supraumbilical small bowel containing hernia. 5.  Aortic Atherosclerosis (ICD10-170.0) Electronically Signed   By: Marnee Spring M.D.   On: 09/09/2015 20:15   Dg Chest 2 View  Result Date: 09/09/2015 CLINICAL DATA:  Right lower quadrant abdominal pain since cholecystectomy, cough, fever EXAM: CHEST  2 VIEW COMPARISON:  CTA chest dated 12/21/2014 FINDINGS: Lungs are clear.  No pleural effusion or pneumothorax. The heart is normal in size. Mild degenerative changes of the visualized thoracolumbar spine. IMPRESSION: Normal chest radiographs. Electronically Signed   By: Charline Bills M.D.   On: 09/09/2015 18:17    EKG: Independently reviewed.  Assessment/Plan Active Problems:   Abdominal wall cellulitis    1. Abdominal wall cellulitis - with fever, leukocytosis 1. IVF 2. Will change ABx to rocephin for what appears to be an abdominal wall cellulitis 1. Abd exam  otherwise benign, tenderness is only over the area of induration, and CT negative, doubt intra abdominal source at this point. 3. BCx 4. Repeat CBC in AM 5. If patient worsens, then would escalate ABx to vanc, however very mild erythema and non-purulent disease at this point with no abscess. 2. Creatinine elevation - 1. IVF 2. Repeat BMP in AM 3. Holding lisinopril - HCTZ   DVT prophylaxis: Lovenox Code Status: Full Family Communication: No family in room Consults called: Dr. Michaell Cowing called by EDP, Dr. Abbey Chatters will see in AM Admission status: Admit to inpatient   Hillary Bow DO [facility] Pager [phone] from 7PM-7AM  If 7AM-7PM, please contact the day physician for the patient [email or web address] Password [email or web address]  09/10/2015, 2:35 AM

## 2015-09-11 DIAGNOSIS — L03311 Cellulitis of abdominal wall: Secondary | ICD-10-CM

## 2015-09-11 DIAGNOSIS — N179 Acute kidney failure, unspecified: Secondary | ICD-10-CM

## 2015-09-11 LAB — CBC WITH DIFFERENTIAL/PLATELET
Basophils Absolute: 0 10*3/uL (ref 0.0–0.1)
Basophils Relative: 0 %
Eosinophils Absolute: 0.1 10*3/uL (ref 0.0–0.7)
Eosinophils Relative: 1 %
HCT: 24.8 % — ABNORMAL LOW (ref 39.0–52.0)
Hemoglobin: 8 g/dL — ABNORMAL LOW (ref 13.0–17.0)
Lymphocytes Relative: 4 %
Lymphs Abs: 0.5 10*3/uL — ABNORMAL LOW (ref 0.7–4.0)
MCH: 28.8 pg (ref 26.0–34.0)
MCHC: 32.3 g/dL (ref 30.0–36.0)
MCV: 89.2 fL (ref 78.0–100.0)
Monocytes Absolute: 1.2 10*3/uL — ABNORMAL HIGH (ref 0.1–1.0)
Monocytes Relative: 9 %
Neutro Abs: 11.1 10*3/uL — ABNORMAL HIGH (ref 1.7–7.7)
Neutrophils Relative %: 86 %
Platelets: 241 10*3/uL (ref 150–400)
RBC: 2.78 MIL/uL — ABNORMAL LOW (ref 4.22–5.81)
RDW: 15 % (ref 11.5–15.5)
WBC: 13 10*3/uL — ABNORMAL HIGH (ref 4.0–10.5)

## 2015-09-11 LAB — RENAL FUNCTION PANEL
Albumin: 2.8 g/dL — ABNORMAL LOW (ref 3.5–5.0)
Anion gap: 10 (ref 5–15)
BUN: 44 mg/dL — ABNORMAL HIGH (ref 6–20)
CO2: 25 mmol/L (ref 22–32)
Calcium: 8.1 mg/dL — ABNORMAL LOW (ref 8.9–10.3)
Chloride: 99 mmol/L — ABNORMAL LOW (ref 101–111)
Creatinine, Ser: 2.68 mg/dL — ABNORMAL HIGH (ref 0.61–1.24)
GFR calc Af Amer: 27 mL/min — ABNORMAL LOW (ref >60)
GFR calc non Af Amer: 24 mL/min — ABNORMAL LOW (ref >60)
Glucose, Bld: 134 mg/dL — ABNORMAL HIGH (ref 65–99)
Phosphorus: 3.9 mg/dL (ref 2.5–4.6)
Potassium: 3.4 mmol/L — ABNORMAL LOW (ref 3.5–5.1)
Sodium: 134 mmol/L — ABNORMAL LOW (ref 135–145)

## 2015-09-11 LAB — C4 COMPLEMENT: Complement C4, Body Fluid: 26 mg/dL (ref 14–44)

## 2015-09-11 LAB — C3 COMPLEMENT: C3 Complement: 177 mg/dL — ABNORMAL HIGH (ref 82–167)

## 2015-09-11 MED ORDER — TRAMADOL HCL 50 MG PO TABS
50.0000 mg | ORAL_TABLET | Freq: Once | ORAL | Status: AC
  Administered 2015-09-11: 50 mg via ORAL
  Filled 2015-09-11: qty 1

## 2015-09-11 NOTE — Progress Notes (Signed)
Pt with increased yellow drainage from abdominal wound, Dr Dartha Lodgegbata aware. Called and spoke with on call surgeon advising about increased drainage. No new orders received

## 2015-09-11 NOTE — Progress Notes (Signed)
Nurse called Dr. Selena BattenKim, [facility] on-call. Dr. Selena BattenKim aware of VS as follows: BP 98/38, T 100.0, P 73, R 18, O2 99% RA. Dr. Selena BattenKim is entering orders for one time dose of Tramadol for pain and increase fluids to 1100ml/hr.

## 2015-09-11 NOTE — Progress Notes (Signed)
Subjective: Pain about the same  Objective: Vital signs in last 24 hours: Temp:  [98.2 F (36.8 C)-100.1 F (37.8 C)] 98.2 F (36.8 C) (08/26 0620) Pulse Rate:  [72-83] 72 (08/26 0620) Resp:  [16-20] 20 (08/26 0620) BP: (105-115)/(43-47) 114/47 (08/26 0620) SpO2:  [97 %-100 %] 100 % (08/26 0620) Last BM Date: 09/10/15  Intake/Output from previous day: 08/25 0701 - 08/26 0700 In: 1876.3 [P.O.:240; I.V.:1636.3] Out: 775 [Urine:775] Intake/Output this shift: No intake/output data recorded.  PE: General- In NAD Abdomen-soft, dull red color now around lateral open wound with firmness deep to this, and a small amount of yellow drainage with no odor  Lab Results:   Recent Labs  09/10/15 0419 09/11/15 0415  WBC 13.9* 13.0*  HGB 7.7* 8.0*  HCT 23.8* 24.8*  PLT 219 241   BMET  Recent Labs  09/10/15 0419 09/11/15 0415  NA 134* 134*  K 3.7 3.4*  CL 99* 99*  CO2 25 25  GLUCOSE 110* 134*  BUN 39* 44*  CREATININE 2.66* 2.68*  CALCIUM 8.1* 8.1*   PT/INR No results for input(s): LABPROT, INR in the last 72 hours. Comprehensive Metabolic Panel:    Component Value Date/Time   NA 134 (L) 09/11/2015 0415   NA 134 (L) 09/10/2015 0419   K 3.4 (L) 09/11/2015 0415   K 3.7 09/10/2015 0419   CL 99 (L) 09/11/2015 0415   CL 99 (L) 09/10/2015 0419   CO2 25 09/11/2015 0415   CO2 25 09/10/2015 0419   BUN 44 (H) 09/11/2015 0415   BUN 39 (H) 09/10/2015 0419   CREATININE 2.68 (H) 09/11/2015 0415   CREATININE 2.66 (H) 09/10/2015 0419   GLUCOSE 134 (H) 09/11/2015 0415   GLUCOSE 110 (H) 09/10/2015 0419   CALCIUM 8.1 (L) 09/11/2015 0415   CALCIUM 8.1 (L) 09/10/2015 0419   AST 34 09/10/2015 0419   AST 30 09/09/2015 1447   ALT 24 09/10/2015 0419   ALT 20 09/09/2015 1447   ALKPHOS 66 09/10/2015 0419   ALKPHOS 77 09/09/2015 1447   BILITOT 0.7 09/10/2015 0419   BILITOT 0.8 09/09/2015 1447   PROT 6.3 (L) 09/10/2015 0419   PROT 6.8 09/09/2015 1447   ALBUMIN 2.8 (L) 09/11/2015  0415   ALBUMIN 2.7 (L) 09/10/2015 0419     Studies/Results: Ct Abdomen Pelvis Wo Contrast  Result Date: 09/09/2015 CLINICAL DATA:  Abdominal pain, generalized. Gallbladder surgery August 18, 2015 EXAM: CT ABDOMEN AND PELVIS WITHOUT CONTRAST TECHNIQUE: Multidetector CT imaging of the abdomen and pelvis was performed following the standard protocol without IV contrast. COMPARISON:  06/17/2015 FINDINGS: Lower chest and abdominal wall: Band of subcutaneous strandy density along the right abdomen. There is no suspected abscess. No soft tissue emphysema. Supraumbilical hernia containing fat and a loop of non edematous small bowel. Distended lower esophagus filled with oral contrast. No superimposed inflammation or wall thickening. Hepatobiliary: No focal liver abnormality.Cholecystectomy. Expected surgical site without evidence of abscess or bile leak. No visible choledocholithiasis. Pancreas: Unremarkable. Spleen: Unremarkable. Adrenals/Urinary Tract: Negative adrenals. No hydronephrosis or stone. Unremarkable bladder. Stomach/Bowel: Postoperative stomach . No inflammation or obstruction. Reproductive:No pathologic findings. Vascular/Lymphatic: Extensive aortic and branch vessel atherosclerotic calcification. No mass or adenopathy. Other: No ascites or pneumoperitoneum. Musculoskeletal: Advanced and diffuse disc and facet degeneration with spurs causing multi-level lumbar canal and foraminal stenosis. IMPRESSION: 1. Nonspecific abdominal wall edema/inflammation along the cholecystectomy incision. No soft tissue emphysema or suspected abscess. 2. Unremarkable postoperative gallbladder fossa. 3. Distended esophagus from gastroesophageal reflux or poor  clearance. 4. Supraumbilical small bowel containing hernia. 5.  Aortic Atherosclerosis (ICD10-170.0) Electronically Signed   By: Marnee Spring M.D.   On: 09/09/2015 20:15   Dg Chest 2 View  Result Date: 09/09/2015 CLINICAL DATA:  Right lower quadrant abdominal  pain since cholecystectomy, cough, fever EXAM: CHEST  2 VIEW COMPARISON:  CTA chest dated 12/21/2014 FINDINGS: Lungs are clear.  No pleural effusion or pneumothorax. The heart is normal in size. Mild degenerative changes of the visualized thoracolumbar spine. IMPRESSION: Normal chest radiographs. Electronically Signed   By: Charline Bills M.D.   On: 09/09/2015 18:17   US Renal  Result Date: 09/10/2015 CLINICAL DATA:  Acute kidney injury EXAM: RENAL / URINARY TRACT ULTRASOUND COMPLETE COMPARISON:  None. FINDINGS: Right Kidney: Length: 11.9 cm. Echogenicity within normal limits. No mass or hydronephrosis visualized. Left Kidney: Length: 12.9 cm. Echogenicity within normal limits. No mass or hydronephrosis visualized. Bladder: Appears normal for degree of bladder distention. IMPRESSION: Normal bilateral kidneys. Electronically Signed   By: Elige Ko   On: 09/10/2015 14:19    Anti-infectives: Anti-infectives    Start     Dose/Rate Route Frequency Ordered Stop   09/10/15 0400  cefTRIAXone (ROCEPHIN) 2 g in dextrose 5 % 50 mL IVPB     2 g 100 mL/hr over 30 Minutes Intravenous Every 24 hours 09/10/15 0257     09/10/15 0230  cefTRIAXone (ROCEPHIN) 1 g in dextrose 5 % 50 mL IVPB  Status:  Discontinued     1 g 100 mL/hr over 30 Minutes Intravenous Every 24 hours 09/10/15 0220 09/10/15 0257   09/10/15 0045  ceFAZolin (ANCEF) IVPB 1 g/50 mL premix  Status:  Discontinued     1 g 100 mL/hr over 30 Minutes Intravenous  Once 09/10/15 0034 09/10/15 0220   09/10/15 0030  ceFAZolin (ANCEF) 3 g in dextrose 5 % 50 mL IVPB  Status:  Discontinued     3 g 130 mL/hr over 30 Minutes Intravenous Every 8 hours 09/09/15 2355 09/10/15 0220   09/10/15 0000  metroNIDAZOLE (FLAGYL) IVPB 500 mg  Status:  Discontinued     500 mg 100 mL/hr over 60 Minutes Intravenous Every 6 hours 09/09/15 2355 09/10/15 0259   09/09/15 2230  cephALEXin (KEFLEX) capsule 500 mg  Status:  Discontinued     500 mg Oral  Once 09/09/15 2216  09/09/15 2223   09/09/15 2230  sulfamethoxazole-trimethoprim (BACTRIM DS,SEPTRA DS) 800-160 MG per tablet 1 tablet  Status:  Discontinued     1 tablet Oral  Once 09/09/15 2216 09/09/15 2223   09/09/15 0000  cephALEXin (KEFLEX) 500 MG capsule  Status:  Discontinued     500 mg Oral 4 times daily 09/09/15 2218 09/10/15    09/09/15 0000  sulfamethoxazole-trimethoprim (BACTRIM DS,SEPTRA DS) 800-160 MG tablet  Status:  Discontinued     1 tablet Oral 2 times daily 09/09/15 2218 09/10/15       Assessment Principal Problem:   Abdominal wall cellulitis-on abxs; WBC improved. Active Problems:   Acute on CKD (chronic kidney disease) stage 3, GFR 30-59 ml/min-worsening renal function   Anemia-hemoglobin down this AM.    LOS: 1 day   Plan: Continue abxs from our standpoint.     Corley Kohls C. 09/11/2015

## 2015-09-11 NOTE — Progress Notes (Signed)
Patient ID: Gary [name], male   DOB: April 06, 1950, 65 y.o.   MRN: [id number]   Right upper quadrant trocar site began draining purulent material this evening. Patient feels about the same.  Exam shows some purulent drainage from the trocar site.Mild erythema and induration. I  Opened the area up with a cotton tip applicator and it tracks below the skin for about 7 or 8 cm into a deep subcutaneous cavity Of uncertain size.After opening up the tract it appears well drained. Continue antibiotics and dressing changes.

## 2015-09-11 NOTE — Progress Notes (Signed)
PROGRESS NOTE    Gary [name]  WUJ:[id number] DOB: Mar 02, 1950 DOA: 09/09/2015 PCP: Londell MohPHARR,[name] DAVIDSON, MD  Outpatient Specialists:   Brief Narrative: 65 y.o. male with medical history significant of Lap chole done 3 weeks ago, bariatric surgery, morbid obesity.  Patient presents to the ED with c/o 1 week history of RUQ pain.  This is dull and constant in nature.  Classically worsening with movement.  He reports low-grade fever, decreased appetite.  His RUQ is tender and he says it is "hard" when he pushes on it.  He notes that he has erythema of this area surrounding a site where a drain was previously in.  ED Course: CT abd pelvis shows cellulitis of abdominal wall surrounding drain site (no gas no cellulitis), umbilical hernia, GERD, and prior gastric bypass surgery.  Assessment & Plan:   Principal Problem:   Abdominal wall cellulitis Active Problems:   CKD (chronic kidney disease) stage 3, GFR 30-59 ml/min  1. Abdominal wall cellulitis - Said to be draining. Surgery Team is following patient. Continue antibiotics. Cellulitis has improved significantly. 2. ARF, likely multifactorial - Stabilizing/peaking. Volume depletion (likely decreased effective circulatory volume) and Medication (ACEI, Lasix, HCTZ and Meloxican) - Avoid Nephrotoxins. Dose all medication as per renal function. 3. Morbid Obesity 4. Hypertension - Optimize. Keep MAP greater than 65 5. CAD, stable.    DVT prophylaxis: Lovenox Code Status: Full Family Communication:   Consultants:   See H and P  Procedures:   None  Antimicrobials:   IV Rocephin    Subjective: Nil complaints. Cellulitis is improving.  Objective: Vitals:   09/10/15 1428 09/10/15 2111 09/11/15 0144 09/11/15 0620  BP: (!) 105/44 (!) 111/43 (!) 115/46 (!) 114/47  Pulse: 73 83 80 72  Resp: 16 16 20 20   Temp: 98.7 F (37.1 C) 100.1 F (37.8 C) 99.6 F (37.6 C) 98.2 F (36.8 C)  TempSrc: Oral Oral Oral Oral  SpO2: 100%  97% 98% 100%  Weight:      Height:        Intake/Output Summary (Last 24 hours) at 09/11/15 1255 Last data filed at 09/11/15 1029  Gross per 24 hour  Intake          1816.25 ml  Output              775 ml  Net          1041.25 ml   Filed Weights   09/10/15 0331  Weight: (!) 182.8 kg (403 lb)    Examination:  General exam: Morbidly obese. Appears calm and comfortable  Respiratory system: Clear to auscultation.   Cardiovascular system: S1 & S2. Gastrointestinal system: Abdomen is morbidly obese, indurated area around RUQ. Some skin changes but no obvious redness (seems to have improved a lot). Organs are difficult to assess.  Central nervous system: Alert and oriented. Moves all limbs. Extremities: Fullness of the legs.    Data Reviewed: I have personally reviewed following labs and imaging studies  CBC:  Recent Labs Lab 09/09/15 1447 09/10/15 0419 09/11/15 0415  WBC 17.4* 13.9* 13.0*  NEUTROABS 15.4*  --  11.1*  HGB 9.1* 7.7* 8.0*  HCT 28.7* 23.8* 24.8*  MCV 92.6 91.9 89.2  PLT 258 219 241   Basic Metabolic Panel:  Recent Labs Lab 09/09/15 1447 09/10/15 0419 09/11/15 0415  NA 134* 134* 134*  K 3.7 3.7 3.4*  CL 98* 99* 99*  CO2 24 25 25   GLUCOSE 112* 110* 134*  BUN 28* 39* 44*  CREATININE 2.28* 2.66* 2.68*  CALCIUM 8.7* 8.1* 8.1*  PHOS  --   --  3.9   GFR: Estimated Creatinine Clearance: 46.1 mL/min (by C-G formula based on SCr of 2.68 mg/dL). Liver Function Tests:  Recent Labs Lab 09/09/15 1447 09/10/15 0419 09/11/15 0415  AST 30 34  --   ALT 20 24  --   ALKPHOS 77 66  --   BILITOT 0.8 0.7  --   PROT 6.8 6.3*  --   ALBUMIN 3.0* 2.7* 2.8*   No results for input(s): LIPASE, AMYLASE in the last 168 hours. No results for input(s): AMMONIA in the last 168 hours. Coagulation Profile: No results for input(s): INR, PROTIME in the last 168 hours. Cardiac Enzymes: No results for input(s): CKTOTAL, CKMB, CKMBINDEX, TROPONINI in the last 168  hours. BNP (last 3 results) No results for input(s): PROBNP in the last 8760 hours. HbA1C: No results for input(s): HGBA1C in the last 72 hours. CBG: No results for input(s): GLUCAP in the last 168 hours. Lipid Profile: No results for input(s): CHOL, HDL, LDLCALC, TRIG, CHOLHDL, LDLDIRECT in the last 72 hours. Thyroid Function Tests: No results for input(s): TSH, T4TOTAL, FREET4, T3FREE, THYROIDAB in the last 72 hours. Anemia Panel:  Recent Labs  09/10/15 1108  FERRITIN 117  TIBC 232*  IRON 10*   Urine analysis:    Component Value Date/Time   COLORURINE AMBER (A) 09/09/2015 2216   APPEARANCEUR CLOUDY (A) 09/09/2015 2216   LABSPEC 1.020 09/09/2015 2216   PHURINE 5.0 09/09/2015 2216   GLUCOSEU NEGATIVE 09/09/2015 2216   HGBUR NEGATIVE 09/09/2015 2216   BILIRUBINUR SMALL (A) 09/09/2015 2216   KETONESUR NEGATIVE 09/09/2015 2216   PROTEINUR NEGATIVE 09/09/2015 2216   NITRITE NEGATIVE 09/09/2015 2216   LEUKOCYTESUR NEGATIVE 09/09/2015 2216   Sepsis Labs: @LABRCNTIP (procalcitonin:4,lacticidven:4)  )No results found for this or any previous visit (from the past 240 hour(s)).       Radiology Studies: Ct Abdomen Pelvis Wo Contrast  Result Date: 09/09/2015 CLINICAL DATA:  Abdominal pain, generalized. Gallbladder surgery August 18, 2015 EXAM: CT ABDOMEN AND PELVIS WITHOUT CONTRAST TECHNIQUE: Multidetector CT imaging of the abdomen and pelvis was performed following the standard protocol without IV contrast. COMPARISON:  06/17/2015 FINDINGS: Lower chest and abdominal wall: Band of subcutaneous strandy density along the right abdomen. There is no suspected abscess. No soft tissue emphysema. Supraumbilical hernia containing fat and a loop of non edematous small bowel. Distended lower esophagus filled with oral contrast. No superimposed inflammation or wall thickening. Hepatobiliary: No focal liver abnormality.Cholecystectomy. Expected surgical site without evidence of abscess or bile  leak. No visible choledocholithiasis. Pancreas: Unremarkable. Spleen: Unremarkable. Adrenals/Urinary Tract: Negative adrenals. No hydronephrosis or stone. Unremarkable bladder. Stomach/Bowel: Postoperative stomach . No inflammation or obstruction. Reproductive:No pathologic findings. Vascular/Lymphatic: Extensive aortic and branch vessel atherosclerotic calcification. No mass or adenopathy. Other: No ascites or pneumoperitoneum. Musculoskeletal: Advanced and diffuse disc and facet degeneration with spurs causing multi-level lumbar canal and foraminal stenosis. IMPRESSION: 1. Nonspecific abdominal wall edema/inflammation along the cholecystectomy incision. No soft tissue emphysema or suspected abscess. 2. Unremarkable postoperative gallbladder fossa. 3. Distended esophagus from gastroesophageal reflux or poor clearance. 4. Supraumbilical small bowel containing hernia. 5.  Aortic Atherosclerosis (ICD10-170.0) Electronically Signed   By: Marnee Spring M.D.   On: 09/09/2015 20:15   Dg Chest 2 View  Result Date: 09/09/2015 CLINICAL DATA:  Right lower quadrant abdominal pain since cholecystectomy, cough, fever EXAM: CHEST  2 VIEW COMPARISON:  CTA chest dated 12/21/2014 FINDINGS: Lungs  are clear.  No pleural effusion or pneumothorax. The heart is normal in size. Mild degenerative changes of the visualized thoracolumbar spine. IMPRESSION: Normal chest radiographs. Electronically Signed   By: Charline Bills M.D.   On: 09/09/2015 18:17   US Renal  Result Date: 09/10/2015 CLINICAL DATA:  Acute kidney injury EXAM: RENAL / URINARY TRACT ULTRASOUND COMPLETE COMPARISON:  None. FINDINGS: Right Kidney: Length: 11.9 cm. Echogenicity within normal limits. No mass or hydronephrosis visualized. Left Kidney: Length: 12.9 cm. Echogenicity within normal limits. No mass or hydronephrosis visualized. Bladder: Appears normal for degree of bladder distention. IMPRESSION: Normal bilateral kidneys. Electronically Signed   By: Elige Ko   On: 09/10/2015 14:19        Scheduled Meds: . acetaminophen  1,000 mg Oral TID  . aspirin EC  81 mg Oral Daily  . atorvastatin  40 mg Oral Daily  . buPROPion  300 mg Oral Daily  . cefTRIAXone (ROCEPHIN)  IV  2 g Intravenous Q24H  . enoxaparin (LOVENOX) injection  90 mg Subcutaneous Daily  . feeding supplement (ENSURE ENLIVE)  237 mL Oral BID BM  . lip balm  1 application Topical BID  . multivitamin with minerals  1 tablet Oral Daily  . pantoprazole  80 mg Oral Daily  . vitamin B-12  1,000 mcg Oral Daily  . vitamin C  500 mg Oral BID   Continuous Infusions: . sodium chloride 75 mL/hr at 09/11/15 0637     LOS: 1 day    Time spent: Greater than 30 Minutes    Berton Mount, MD  [facility] Pager #: [phone] 7PM-7AM contact night coverage as above

## 2015-09-12 LAB — RENAL FUNCTION PANEL
Albumin: 2.7 g/dL — ABNORMAL LOW (ref 3.5–5.0)
Anion gap: 9 (ref 5–15)
BUN: 43 mg/dL — ABNORMAL HIGH (ref 6–20)
CO2: 24 mmol/L (ref 22–32)
Calcium: 8 mg/dL — ABNORMAL LOW (ref 8.9–10.3)
Chloride: 101 mmol/L (ref 101–111)
Creatinine, Ser: 1.99 mg/dL — ABNORMAL HIGH (ref 0.61–1.24)
GFR calc Af Amer: 39 mL/min — ABNORMAL LOW (ref >60)
GFR calc non Af Amer: 34 mL/min — ABNORMAL LOW (ref >60)
Glucose, Bld: 117 mg/dL — ABNORMAL HIGH (ref 65–99)
Phosphorus: 3.2 mg/dL (ref 2.5–4.6)
Potassium: 3.5 mmol/L (ref 3.5–5.1)
Sodium: 134 mmol/L — ABNORMAL LOW (ref 135–145)

## 2015-09-12 LAB — CBC WITH DIFFERENTIAL/PLATELET
Basophils Absolute: 0 10*3/uL (ref 0.0–0.1)
Basophils Relative: 0 %
Eosinophils Absolute: 0.3 10*3/uL (ref 0.0–0.7)
Eosinophils Relative: 3 %
HCT: 24.5 % — ABNORMAL LOW (ref 39.0–52.0)
Hemoglobin: 8 g/dL — ABNORMAL LOW (ref 13.0–17.0)
Lymphocytes Relative: 5 %
Lymphs Abs: 0.5 10*3/uL — ABNORMAL LOW (ref 0.7–4.0)
MCH: 29.5 pg (ref 26.0–34.0)
MCHC: 32.7 g/dL (ref 30.0–36.0)
MCV: 90.4 fL (ref 78.0–100.0)
Monocytes Absolute: 0.9 10*3/uL (ref 0.1–1.0)
Monocytes Relative: 9 %
Neutro Abs: 7.5 10*3/uL (ref 1.7–7.7)
Neutrophils Relative %: 83 %
Platelets: 261 10*3/uL (ref 150–400)
RBC: 2.71 MIL/uL — ABNORMAL LOW (ref 4.22–5.81)
RDW: 15.3 % (ref 11.5–15.5)
WBC: 9.1 10*3/uL (ref 4.0–10.5)

## 2015-09-12 MED ORDER — ALUM & MAG HYDROXIDE-SIMETH 200-200-20 MG/5ML PO SUSP
30.0000 mL | Freq: Once | ORAL | Status: AC
  Administered 2015-09-12: 30 mL via ORAL
  Filled 2015-09-12: qty 30

## 2015-09-12 NOTE — Progress Notes (Signed)
PROGRESS NOTE    Gary [name]  HYQ:[id number] DOB: 12/11/1950 DOA: 09/09/2015 PCP: Londell MohPHARR,[name] DAVIDSON, MD  Outpatient Specialists:   Brief Narrative: 65 y.o. male with medical history significant of Lap chole done 3 weeks ago, bariatric surgery, morbid obesity.  Patient presents to the ED with c/o 1 week history of RUQ pain.  This is dull and constant in nature.  Classically worsening with movement.  He reports low-grade fever, decreased appetite.  His RUQ is tender and he says it is "hard" when he pushes on it.  He notes that he has erythema of this area surrounding a site where a drain was previously in.  ED Course: CT abd pelvis shows cellulitis of abdominal wall surrounding drain site (no gas no cellulitis), umbilical hernia, GERD, and prior gastric bypass surgery.  Assessment & Plan:   Principal Problem:   Abdominal wall cellulitis Active Problems:   CKD (chronic kidney disease) stage 3, GFR 30-59 ml/min  1. Abdominal wall cellulitis - Surgery team is managing. Wound swab for culture. Continue antibiotics. 2. ARF, likely multifactorial - Improving. Scr is down to 1.99.S  3. Morbid Obesity 4. Hypertension - Optimize. Keep MAP greater than 65 5. CAD, stable.    DVT prophylaxis: Lovenox Code Status: Full Family Communication:   Consultants:   Surgery  Procedures:   None  Antimicrobials:   IV Rocephin    Subjective: Nil complaints. Cellulitis is improving. Wound is draining, but now being packed.  Objective: Vitals:   09/11/15 2050 09/11/15 2209 09/12/15 0457 09/12/15 0507  BP: (!) 111/44 (!) 98/38 (!) 119/37 (!) 106/42  Pulse: 73  74   Resp: 18  16   Temp: 100 F (37.8 C)  97.9 F (36.6 C)   TempSrc: Oral  Oral   SpO2: 99%  100%   Weight:      Height:        Intake/Output Summary (Last 24 hours) at 09/12/15 1108 Last data filed at 09/12/15 0959  Gross per 24 hour  Intake          2703.67 ml  Output             1725 ml  Net            978.67 ml   Filed Weights   09/10/15 0331  Weight: (!) 182.8 kg (403 lb)    Examination:  General exam: Morbidly obese. Appears calm and comfortable  Respiratory system: Clear to auscultation.   Cardiovascular system: S1 & S2. Gastrointestinal system: Abdomen is morbidly obese, indurated area around RUQ. Some skin changes but no obvious redness (seems to have improved a lot). Organs are difficult to assess.  Central nervous system: Alert and oriented. Moves all limbs. Extremities: Fullness of the legs.    Data Reviewed: I have personally reviewed following labs and imaging studies  CBC:  Recent Labs Lab 09/09/15 1447 09/10/15 0419 09/11/15 0415 09/12/15 0433  WBC 17.4* 13.9* 13.0* 9.1  NEUTROABS 15.4*  --  11.1* 7.5  HGB 9.1* 7.7* 8.0* 8.0*  HCT 28.7* 23.8* 24.8* 24.5*  MCV 92.6 91.9 89.2 90.4  PLT 258 219 241 261   Basic Metabolic Panel:  Recent Labs Lab 09/09/15 1447 09/10/15 0419 09/11/15 0415 09/12/15 0433  NA 134* 134* 134* 134*  K 3.7 3.7 3.4* 3.5  CL 98* 99* 99* 101  CO2 24 25 25 24   GLUCOSE 112* 110* 134* 117*  BUN 28* 39* 44* 43*  CREATININE 2.28* 2.66* 2.68* 1.99*  CALCIUM 8.7*  8.1* 8.1* 8.0*  PHOS  --   --  3.9 3.2   GFR: Estimated Creatinine Clearance: 62.1 mL/min (by C-G formula based on SCr of 1.99 mg/dL). Liver Function Tests:  Recent Labs Lab 09/09/15 1447 09/10/15 0419 09/11/15 0415 09/12/15 0433  AST 30 34  --   --   ALT 20 24  --   --   ALKPHOS 77 66  --   --   BILITOT 0.8 0.7  --   --   PROT 6.8 6.3*  --   --   ALBUMIN 3.0* 2.7* 2.8* 2.7*   No results for input(s): LIPASE, AMYLASE in the last 168 hours. No results for input(s): AMMONIA in the last 168 hours. Coagulation Profile: No results for input(s): INR, PROTIME in the last 168 hours. Cardiac Enzymes: No results for input(s): CKTOTAL, CKMB, CKMBINDEX, TROPONINI in the last 168 hours. BNP (last 3 results) No results for input(s): PROBNP in the last 8760  hours. HbA1C: No results for input(s): HGBA1C in the last 72 hours. CBG: No results for input(s): GLUCAP in the last 168 hours. Lipid Profile: No results for input(s): CHOL, HDL, LDLCALC, TRIG, CHOLHDL, LDLDIRECT in the last 72 hours. Thyroid Function Tests: No results for input(s): TSH, T4TOTAL, FREET4, T3FREE, THYROIDAB in the last 72 hours. Anemia Panel:  Recent Labs  09/10/15 1108  FERRITIN 117  TIBC 232*  IRON 10*   Urine analysis:    Component Value Date/Time   COLORURINE AMBER (A) 09/09/2015 2216   APPEARANCEUR CLOUDY (A) 09/09/2015 2216   LABSPEC 1.020 09/09/2015 2216   PHURINE 5.0 09/09/2015 2216   GLUCOSEU NEGATIVE 09/09/2015 2216   HGBUR NEGATIVE 09/09/2015 2216   BILIRUBINUR SMALL (A) 09/09/2015 2216   KETONESUR NEGATIVE 09/09/2015 2216   PROTEINUR NEGATIVE 09/09/2015 2216   NITRITE NEGATIVE 09/09/2015 2216   LEUKOCYTESUR NEGATIVE 09/09/2015 2216   Sepsis Labs: @LABRCNTIP (procalcitonin:4,lacticidven:4)  ) Recent Results (from the past 240 hour(s))  Culture, blood (routine x 2)     Status: None (Preliminary result)   Collection Time: 09/10/15 12:49 AM  Result Value Ref Range Status   Specimen Description BLOOD LEFT ANTECUBITAL  Final   Special Requests BOTTLES DRAWN AEROBIC AND ANAEROBIC 5CC  Final   Culture   Final    NO GROWTH 1 DAY Performed at [facility]    Report Status PENDING  Incomplete  Culture, blood (routine x 2)     Status: None (Preliminary result)   Collection Time: 09/10/15 12:50 AM  Result Value Ref Range Status   Specimen Description BLOOD RIGHT HAND  Final   Special Requests BOTTLES DRAWN AEROBIC AND ANAEROBIC  Final   Culture   Final    NO GROWTH 1 DAY Performed at [facility]    Report Status PENDING  Incomplete         Radiology Studies: US Renal  Result Date: 09/10/2015 CLINICAL DATA:  Acute kidney injury EXAM: RENAL / URINARY TRACT ULTRASOUND COMPLETE COMPARISON:  None. FINDINGS: Right Kidney:  Length: 11.9 cm. Echogenicity within normal limits. No mass or hydronephrosis visualized. Left Kidney: Length: 12.9 cm. Echogenicity within normal limits. No mass or hydronephrosis visualized. Bladder: Appears normal for degree of bladder distention. IMPRESSION: Normal bilateral kidneys. Electronically Signed   By: Elige Ko   On: 09/10/2015 14:19        Scheduled Meds: . acetaminophen  1,000 mg Oral TID  . aspirin EC  81 mg Oral Daily  . atorvastatin  40 mg Oral  Daily  . buPROPion  300 mg Oral Daily  . cefTRIAXone (ROCEPHIN)  IV  2 g Intravenous Q24H  . enoxaparin (LOVENOX) injection  90 mg Subcutaneous Daily  . feeding supplement (ENSURE ENLIVE)  237 mL Oral BID BM  . lip balm  1 application Topical BID  . multivitamin with minerals  1 tablet Oral Daily  . pantoprazole  80 mg Oral Daily  . vitamin B-12  1,000 mcg Oral Daily  . vitamin C  500 mg Oral BID   Continuous Infusions: . sodium chloride 100 mL/hr at 09/12/15 0603     LOS: 2 days    Time spent: Greater than 30 Minutes    Berton Mount, MD  [facility] Pager #: [phone] 7PM-7AM contact night coverage as above

## 2015-09-12 NOTE — Progress Notes (Signed)
Subjective: Pain about the same  Objective: Vital signs in last 24 hours: Temp:  [97.9 F (36.6 C)-100 F (37.8 C)] 97.9 F (36.6 C) (08/27 0457) Pulse Rate:  [73-74] 74 (08/27 0457) Resp:  [16-18] 16 (08/27 0457) BP: (98-134)/(37-44) 106/42 (08/27 0507) SpO2:  [96 %-100 %] 100 % (08/27 0457) Last BM Date: 09/11/15  Intake/Output from previous day: 08/26 0701 - 08/27 0700 In: 2703.7 [P.O.:1680; I.V.:1023.7] Out: 925 [Urine:925] Intake/Output this shift: No intake/output data recorded.  PE: General- In NAD Abdomen-soft, purulence draining from RUQ site Lab Results:   Recent Labs  09/11/15 0415 09/12/15 0433  WBC 13.0* 9.1  HGB 8.0* 8.0*  HCT 24.8* 24.5*  PLT 241 261   BMET  Recent Labs  09/11/15 0415 09/12/15 0433  NA 134* 134*  K 3.4* 3.5  CL 99* 101  CO2 25 24  GLUCOSE 134* 117*  BUN 44* 43*  CREATININE 2.68* 1.99*  CALCIUM 8.1* 8.0*   PT/INR No results for input(s): LABPROT, INR in the last 72 hours. Comprehensive Metabolic Panel:    Component Value Date/Time   NA 134 (L) 09/12/2015 0433   NA 134 (L) 09/11/2015 0415   K 3.5 09/12/2015 0433   K 3.4 (L) 09/11/2015 0415   CL 101 09/12/2015 0433   CL 99 (L) 09/11/2015 0415   CO2 24 09/12/2015 0433   CO2 25 09/11/2015 0415   BUN 43 (H) 09/12/2015 0433   BUN 44 (H) 09/11/2015 0415   CREATININE 1.99 (H) 09/12/2015 0433   CREATININE 2.68 (H) 09/11/2015 0415   GLUCOSE 117 (H) 09/12/2015 0433   GLUCOSE 134 (H) 09/11/2015 0415   CALCIUM 8.0 (L) 09/12/2015 0433   CALCIUM 8.1 (L) 09/11/2015 0415   AST 34 09/10/2015 0419   AST 30 09/09/2015 1447   ALT 24 09/10/2015 0419   ALT 20 09/09/2015 1447   ALKPHOS 66 09/10/2015 0419   ALKPHOS 77 09/09/2015 1447   BILITOT 0.7 09/10/2015 0419   BILITOT 0.8 09/09/2015 1447   PROT 6.3 (L) 09/10/2015 0419   PROT 6.8 09/09/2015 1447   ALBUMIN 2.7 (L) 09/12/2015 0433   ALBUMIN 2.8 (L) 09/11/2015 0415     Studies/Results: [facility]  Result Date:  09/10/2015 CLINICAL DATA:  Acute kidney injury EXAM: RENAL / URINARY TRACT ULTRASOUND COMPLETE COMPARISON:  None. FINDINGS: Right Kidney: Length: 11.9 cm. Echogenicity within normal limits. No mass or hydronephrosis visualized. Left Kidney: Length: 12.9 cm. Echogenicity within normal limits. No mass or hydronephrosis visualized. Bladder: Appears normal for degree of bladder distention. IMPRESSION: Normal bilateral kidneys. Electronically Signed   By: Elige KoHetal  Patel   On: 09/10/2015 14:19    Anti-infectives: Anti-infectives    Start     Dose/Rate Route Frequency Ordered Stop   09/10/15 0400  cefTRIAXone (ROCEPHIN) 2 g in dextrose 5 % 50 mL IVPB     2 g 100 mL/hr over 30 Minutes Intravenous Every 24 hours 09/10/15 0257     09/10/15 0230  cefTRIAXone (ROCEPHIN) 1 g in dextrose 5 % 50 mL IVPB  Status:  Discontinued     1 g 100 mL/hr over 30 Minutes Intravenous Every 24 hours 09/10/15 0220 09/10/15 0257   09/10/15 0045  ceFAZolin (ANCEF) IVPB 1 g/50 mL premix  Status:  Discontinued     1 g 100 mL/hr over 30 Minutes Intravenous  Once 09/10/15 0034 09/10/15 0220   09/10/15 0030  ceFAZolin (ANCEF) 3 g in dextrose 5 % 50 mL IVPB  Status:  Discontinued  3 g 130 mL/hr over 30 Minutes Intravenous Every 8 hours 09/09/15 2355 09/10/15 0220   09/10/15 0000  metroNIDAZOLE (FLAGYL) IVPB 500 mg  Status:  Discontinued     500 mg 100 mL/hr over 60 Minutes Intravenous Every 6 hours 09/09/15 2355 09/10/15 0259   09/09/15 2230  cephALEXin (KEFLEX) capsule 500 mg  Status:  Discontinued     500 mg Oral  Once 09/09/15 2216 09/09/15 2223   09/09/15 2230  sulfamethoxazole-trimethoprim (BACTRIM DS,SEPTRA DS) 800-160 MG per tablet 1 tablet  Status:  Discontinued     1 tablet Oral  Once 09/09/15 2216 09/09/15 2223   09/09/15 0000  cephALEXin (KEFLEX) 500 MG capsule  Status:  Discontinued     500 mg Oral 4 times daily 09/09/15 2218 09/10/15    09/09/15 0000  sulfamethoxazole-trimethoprim (BACTRIM DS,SEPTRA DS) 800-160  MG tablet  Status:  Discontinued     1 tablet Oral 2 times daily 09/09/15 2218 09/10/15       Assessment Principal Problem:   Abdominal wall abscess: pack wound bid Active Problems:   Acute on CKD (chronic kidney disease) stage 3, GFR 30-59 ml/min- Cr improving   Anemia-hemoglobin stable    LOS: 2 days   Plan: Pack wound BID.  Ok to switch to PO abx and d/c when stable   Zettie Gootee C. 09/12/2015

## 2015-09-13 LAB — CBC WITH DIFFERENTIAL/PLATELET
Basophils Absolute: 0 10*3/uL (ref 0.0–0.1)
Basophils Relative: 1 %
Eosinophils Absolute: 0.4 10*3/uL (ref 0.0–0.7)
Eosinophils Relative: 6 %
HCT: 24.9 % — ABNORMAL LOW (ref 39.0–52.0)
Hemoglobin: 8 g/dL — ABNORMAL LOW (ref 13.0–17.0)
Lymphocytes Relative: 6 %
Lymphs Abs: 0.4 10*3/uL — ABNORMAL LOW (ref 0.7–4.0)
MCH: 29.4 pg (ref 26.0–34.0)
MCHC: 32.1 g/dL (ref 30.0–36.0)
MCV: 91.5 fL (ref 78.0–100.0)
Monocytes Absolute: 0.6 10*3/uL (ref 0.1–1.0)
Monocytes Relative: 9 %
Neutro Abs: 5.3 10*3/uL (ref 1.7–7.7)
Neutrophils Relative %: 78 %
Platelets: 289 10*3/uL (ref 150–400)
RBC: 2.72 MIL/uL — ABNORMAL LOW (ref 4.22–5.81)
RDW: 15.4 % (ref 11.5–15.5)
WBC: 6.7 10*3/uL (ref 4.0–10.5)

## 2015-09-13 LAB — RENAL FUNCTION PANEL
Albumin: 2.6 g/dL — ABNORMAL LOW (ref 3.5–5.0)
Anion gap: 6 (ref 5–15)
BUN: 29 mg/dL — ABNORMAL HIGH (ref 6–20)
CO2: 24 mmol/L (ref 22–32)
Calcium: 8.1 mg/dL — ABNORMAL LOW (ref 8.9–10.3)
Chloride: 104 mmol/L (ref 101–111)
Creatinine, Ser: 1.31 mg/dL — ABNORMAL HIGH (ref 0.61–1.24)
GFR calc Af Amer: >60 mL/min (ref >60)
GFR calc non Af Amer: 56 mL/min — ABNORMAL LOW (ref >60)
Glucose, Bld: 120 mg/dL — ABNORMAL HIGH (ref 65–99)
Phosphorus: 2.3 mg/dL — ABNORMAL LOW (ref 2.5–4.6)
Potassium: 3.8 mmol/L (ref 3.5–5.1)
Sodium: 134 mmol/L — ABNORMAL LOW (ref 135–145)

## 2015-09-13 LAB — ANTINUCLEAR ANTIBODIES, IFA: ANA Ab, IFA: NEGATIVE

## 2015-09-13 MED ORDER — OXYCODONE-ACETAMINOPHEN 5-325 MG PO TABS
1.0000 | ORAL_TABLET | ORAL | Status: DC | PRN
  Administered 2015-09-13 – 2015-09-14 (×4): 1 via ORAL
  Filled 2015-09-13 (×4): qty 1

## 2015-09-13 NOTE — Progress Notes (Signed)
PROGRESS NOTE    Gary [name]  EXH:[id number] DOB: 1950/05/11 DOA: 09/09/2015 PCP: [name], MD  Outpatient Specialists:   Brief Narrative: 64 y.o. male with medical history significant of Lap chole done 3 weeks ago, bariatric surgery, morbid obesity.  Patient presents to the ED with c/o 1 week history of RUQ pain.  This is dull and constant in nature.  Classically worsening with movement.  He reports low-grade fever, decreased appetite.  His RUQ is tender and he says it is "hard" when he pushes on it.  He notes that he has erythema of this area surrounding a site where a drain was previously in.  ED Course: CT abd pelvis shows cellulitis of abdominal wall surrounding drain site (no gas no cellulitis), umbilical hernia, GERD, and prior gastric bypass surgery.  Assessment & Plan:   Principal Problem:   Abdominal wall cellulitis Active Problems:   CKD (chronic kidney disease) stage 3, GFR 30-59 ml/min  1. Abdominal wall cellulitis - Surgery Team is following patient. Continue antibiotics. Cellulitis has improved significantly. 2. ARF, likely multifactorial - Resolved significantly. Volume depletion (likely decreased effective circulatory volume) and Medication (ACEI, Lasix, HCTZ and Meloxican) - Avoid Nephrotoxins. Dose all medication as per renal function. 3. Morbid Obesity 4. Hypertension - Optimize. Keep MAP greater than 65 5. CAD, stable.    DVT prophylaxis: Lovenox Code Status: Full Family Communication:   Consultants:   See H and P  Procedures:   None  Antimicrobials:   IV Rocephin    Subjective: Nil complaints. Cellulitis is improving.  Objective: Vitals:   09/12/15 1447 09/12/15 2111 09/13/15 0541 09/13/15 1436  BP: (!) 103/53 (!) 123/46 111/62 133/65  Pulse: 72 73 71 77  Resp: 18 17 18 18   Temp: 98.5 F (36.9 C) 98.7 F (37.1 C) 98.4 F (36.9 C) 98.3 F (36.8 C)  TempSrc: Oral Oral Oral Oral  SpO2: 100% 100% 100% 100%  Weight:       Height:        Intake/Output Summary (Last 24 hours) at 09/13/15 1606 Last data filed at 09/13/15 1436  Gross per 24 hour  Intake             3945 ml  Output             2100 ml  Net             1845 ml   Filed Weights   09/10/15 0331  Weight: (!) 182.8 kg (403 lb)    Examination:  General exam: Morbidly obese. Appears calm and comfortable  Respiratory system: Clear to auscultation.   Cardiovascular system: S1 & S2. Gastrointestinal system: Abdomen is morbidly obese, indurated area around RUQ. Some skin changes but no obvious redness (seems to have improved a lot). Organs are difficult to assess.  Central nervous system: Alert and oriented. Moves all limbs. Extremities: Fullness of the legs.    Data Reviewed: I have personally reviewed following labs and imaging studies  CBC:  Recent Labs Lab 09/09/15 1447 09/10/15 0419 09/11/15 0415 09/12/15 0433 09/13/15 0947  WBC 17.4* 13.9* 13.0* 9.1 6.7  NEUTROABS 15.4*  --  11.1* 7.5 5.3  HGB 9.1* 7.7* 8.0* 8.0* 8.0*  HCT 28.7* 23.8* 24.8* 24.5* 24.9*  MCV 92.6 91.9 89.2 90.4 91.5  PLT 258 219 241 261 289   Basic Metabolic Panel:  Recent Labs Lab 09/09/15 1447 09/10/15 0419 09/11/15 0415 09/12/15 0433 09/13/15 0947  NA 134* 134* 134* 134* 134*  K 3.7  3.7 3.4* 3.5 3.8  CL 98* 99* 99* 101 104  CO2 24 25 25 24 24   GLUCOSE 112* 110* 134* 117* 120*  BUN 28* 39* 44* 43* 29*  CREATININE 2.28* 2.66* 2.68* 1.99* 1.31*  CALCIUM 8.7* 8.1* 8.1* 8.0* 8.1*  PHOS  --   --  3.9 3.2 2.3*   GFR: Estimated Creatinine Clearance: 94.3 mL/min (by C-G formula based on SCr of 1.31 mg/dL). Liver Function Tests:  Recent Labs Lab 09/09/15 1447 09/10/15 0419 09/11/15 0415 09/12/15 0433 09/13/15 0947  AST 30 34  --   --   --   ALT 20 24  --   --   --   ALKPHOS 77 66  --   --   --   BILITOT 0.8 0.7  --   --   --   PROT 6.8 6.3*  --   --   --   ALBUMIN 3.0* 2.7* 2.8* 2.7* 2.6*   No results for input(s): LIPASE, AMYLASE in the  last 168 hours. No results for input(s): AMMONIA in the last 168 hours. Coagulation Profile: No results for input(s): INR, PROTIME in the last 168 hours. Cardiac Enzymes: No results for input(s): CKTOTAL, CKMB, CKMBINDEX, TROPONINI in the last 168 hours. BNP (last 3 results) No results for input(s): PROBNP in the last 8760 hours. HbA1C: No results for input(s): HGBA1C in the last 72 hours. CBG: No results for input(s): GLUCAP in the last 168 hours. Lipid Profile: No results for input(s): CHOL, HDL, LDLCALC, TRIG, CHOLHDL, LDLDIRECT in the last 72 hours. Thyroid Function Tests: No results for input(s): TSH, T4TOTAL, FREET4, T3FREE, THYROIDAB in the last 72 hours. Anemia Panel: No results for input(s): VITAMINB12, FOLATE, FERRITIN, TIBC, IRON, RETICCTPCT in the last 72 hours. Urine analysis:    Component Value Date/Time   COLORURINE AMBER (A) 09/09/2015 2216   APPEARANCEUR CLOUDY (A) 09/09/2015 2216   LABSPEC 1.020 09/09/2015 2216   PHURINE 5.0 09/09/2015 2216   GLUCOSEU NEGATIVE 09/09/2015 2216   HGBUR NEGATIVE 09/09/2015 2216   BILIRUBINUR SMALL (A) 09/09/2015 2216   KETONESUR NEGATIVE 09/09/2015 2216   PROTEINUR NEGATIVE 09/09/2015 2216   NITRITE NEGATIVE 09/09/2015 2216   LEUKOCYTESUR NEGATIVE 09/09/2015 2216   Sepsis Labs: @LABRCNTIP (procalcitonin:4,lacticidven:4)  ) Recent Results (from the past 240 hour(s))  Culture, blood (routine x 2)     Status: None (Preliminary result)   Collection Time: 09/10/15 12:49 AM  Result Value Ref Range Status   Specimen Description BLOOD LEFT ANTECUBITAL  Final   Special Requests BOTTLES DRAWN AEROBIC AND ANAEROBIC 5CC  Final   Culture   Final    NO GROWTH 2 DAYS [facility]    Report Status PENDING  Incomplete  Culture, blood (routine x 2)     Status: None (Preliminary result)   Collection Time: 09/10/15 12:50 AM  Result Value Ref Range Status   Specimen Description BLOOD RIGHT HAND  Final   Special  Requests BOTTLES DRAWN AEROBIC AND ANAEROBIC 4ML  Final   Culture   Final    NO GROWTH 2 DAYS Performed [facility]    Report Status PENDING  Incomplete  Aerobic/Anaerobic Culture (surgical/deep wound)     Status: None (Preliminary result)   Collection Time: 09/12/15 11:09 AM  Result Value Ref Range Status   Specimen Description WOUND DRAINAGE  Final   Special Requests NONE  Final   Gram Stain   Final    FEW WBC PRESENT,BOTH PMN AND MONONUCLEAR FEW  GRAM POSITIVE COCCI Performed at [facility]    Culture ABUNDANT STAPHYLOCOCCUS AUREUS  Final   Report Status PENDING  Incomplete         Radiology Studies: No results found.      Scheduled Meds: . aspirin EC  81 mg Oral Daily  . atorvastatin  40 mg Oral Daily  . buPROPion  300 mg Oral Daily  . cefTRIAXone (ROCEPHIN)  IV  2 g Intravenous Q24H  . enoxaparin (LOVENOX) injection  90 mg Subcutaneous Daily  . feeding supplement (ENSURE ENLIVE)  237 mL Oral BID BM  . lip balm  1 application Topical BID  . multivitamin with minerals  1 tablet Oral Daily  . pantoprazole  80 mg Oral Daily  . vitamin B-12  1,000 mcg Oral Daily  . vitamin C  500 mg Oral BID   Continuous Infusions: . sodium chloride 100 mL/hr (09/13/15 1338)     LOS: 3 days    Time spent: Greater than 30 Minutes    Berton Mount, MD  [facility] Pager #: [phone] 7PM-7AM contact night coverage as above

## 2015-09-13 NOTE — Progress Notes (Signed)
Subjective: Still having RUQ soreness when he gets up.  Objective: Vital signs in last 24 hours: Temp:  [98.4 F (36.9 C)-98.7 F (37.1 C)] 98.4 F (36.9 C) (08/28 0541) Pulse Rate:  [71-75] 71 (08/28 0541) Resp:  [17-18] 18 (08/28 0541) BP: (103-135)/(46-62) 111/62 (08/28 0541) SpO2:  [100 %] 100 % (08/28 0541) Last BM Date: 09/12/15  Intake/Output from previous day: 08/27 0701 - 08/28 0700 In: 4065 [P.O.:1860; I.V.:2205] Out: 2325 [Urine:2325] Intake/Output this shift: Total I/O In: 240 [P.O.:240] Out: 300 [Urine:300]  PE: General- In NAD Abdomen-soft, drain site wound draining purulent fluid; erythema and induration improved.  Lab Results:   Recent Labs  09/12/15 0433 09/13/15 0947  WBC 9.1 6.7  HGB 8.0* 8.0*  HCT 24.5* 24.9*  PLT 261 289   BMET  Recent Labs  09/11/15 0415 09/12/15 0433  NA 134* 134*  K 3.4* 3.5  CL 99* 101  CO2 25 24  GLUCOSE 134* 117*  BUN 44* 43*  CREATININE 2.68* 1.99*  CALCIUM 8.1* 8.0*   PT/INR No results for input(s): LABPROT, INR in the last 72 hours. Comprehensive Metabolic Panel:    Component Value Date/Time   NA 134 (L) 09/12/2015 0433   NA 134 (L) 09/11/2015 0415   K 3.5 09/12/2015 0433   K 3.4 (L) 09/11/2015 0415   CL 101 09/12/2015 0433   CL 99 (L) 09/11/2015 0415   CO2 24 09/12/2015 0433   CO2 25 09/11/2015 0415   BUN 43 (H) 09/12/2015 0433   BUN 44 (H) 09/11/2015 0415   CREATININE 1.99 (H) 09/12/2015 0433   CREATININE 2.68 (H) 09/11/2015 0415   GLUCOSE 117 (H) 09/12/2015 0433   GLUCOSE 134 (H) 09/11/2015 0415   CALCIUM 8.0 (L) 09/12/2015 0433   CALCIUM 8.1 (L) 09/11/2015 0415   AST 34 09/10/2015 0419   AST 30 09/09/2015 1447   ALT 24 09/10/2015 0419   ALT 20 09/09/2015 1447   ALKPHOS 66 09/10/2015 0419   ALKPHOS 77 09/09/2015 1447   BILITOT 0.7 09/10/2015 0419   BILITOT 0.8 09/09/2015 1447   PROT 6.3 (L) 09/10/2015 0419   PROT 6.8 09/09/2015 1447   ALBUMIN 2.7 (L) 09/12/2015 0433   ALBUMIN  2.8 (L) 09/11/2015 0415     Studies/Results: No results found.  Anti-infectives: Anti-infectives    Start     Dose/Rate Route Frequency Ordered Stop   09/10/15 0400  cefTRIAXone (ROCEPHIN) 2 g in dextrose 5 % 50 mL IVPB     2 g 100 mL/hr over 30 Minutes Intravenous Every 24 hours 09/10/15 0257     09/10/15 0230  cefTRIAXone (ROCEPHIN) 1 g in dextrose 5 % 50 mL IVPB  Status:  Discontinued     1 g 100 mL/hr over 30 Minutes Intravenous Every 24 hours 09/10/15 0220 09/10/15 0257   09/10/15 0045  ceFAZolin (ANCEF) IVPB 1 g/50 mL premix  Status:  Discontinued     1 g 100 mL/hr over 30 Minutes Intravenous  Once 09/10/15 0034 09/10/15 0220   09/10/15 0030  ceFAZolin (ANCEF) 3 g in dextrose 5 % 50 mL IVPB  Status:  Discontinued     3 g 130 mL/hr over 30 Minutes Intravenous Every 8 hours 09/09/15 2355 09/10/15 0220   09/10/15 0000  metroNIDAZOLE (FLAGYL) IVPB 500 mg  Status:  Discontinued     500 mg 100 mL/hr over 60 Minutes Intravenous Every 6 hours 09/09/15 2355 09/10/15 0259   09/09/15 2230  cephALEXin (KEFLEX) capsule 500 mg  Status:  Discontinued     500 mg Oral  Once 09/09/15 2216 09/09/15 2223   09/09/15 2230  sulfamethoxazole-trimethoprim (BACTRIM DS,SEPTRA DS) 800-160 MG per tablet 1 tablet  Status:  Discontinued     1 tablet Oral  Once 09/09/15 2216 09/09/15 2223   09/09/15 0000  cephALEXin (KEFLEX) 500 MG capsule  Status:  Discontinued     500 mg Oral 4 times daily 09/09/15 2218 09/10/15    09/09/15 0000  sulfamethoxazole-trimethoprim (BACTRIM DS,SEPTRA DS) 800-160 MG tablet  Status:  Discontinued     1 tablet Oral 2 times daily 09/09/15 2218 09/10/15       Assessment Principal Problem:   Drain site wound infection-continues to drain well; erythema gone and induration improved.   AKI-improving    LOS: 3 days   Plan: Ambulate.  HHRN.  Hopefully home tomorrow on oral abxs.   Gary [name] Shela CommonsJ 09/13/2015

## 2015-09-14 MED ORDER — AMLODIPINE BESYLATE 5 MG PO TABS: 5.0000 mg | ORAL_TABLET | Freq: Every day | ORAL | 1 refills | Status: DC

## 2015-09-14 MED ORDER — DOXYCYCLINE HYCLATE 50 MG PO CAPS: 100.0000 mg | ORAL_CAPSULE | Freq: Two times a day (BID) | ORAL | 0 refills | Status: AC

## 2015-09-14 MED ORDER — ENSURE ENLIVE PO LIQD: 237.0000 mL | Freq: Two times a day (BID) | ORAL | 1 refills | Status: DC

## 2015-09-14 NOTE — Discharge Summary (Signed)
Physician Discharge Summary  Gary [name]:[id number] DOB: 10-07-1950 DOA: 09/09/2015  PCP: [name], MD  Admit date: 09/09/2015 Discharge date: 09/14/2015  Time spent: Greater than 30 minutes  Recommendations for Outpatient Follow-up:  1. Follow up with PCP within one week 2. PCP to repeat BMP on next visit 3. Follow up with Surgery team in one week 4. Continue wound care as directed by Surgery team   Discharge Diagnoses:  Principal Problem:   Abdominal wall cellulitis Active Problems:   AKI, resolved significantly   Hypertension   Morbid obesity   CAD, stable  Discharge Condition: Stable  Diet recommendation: Heart Healthy diet  Filed Weights   09/10/15 0331  Weight: (!) 182.8 kg (403 lb)    History of present illness: 65 year old male, morbidly obese, with medical history significant of Lap cholecystectomy done 3 weeks prior to presentation, bariatric surgery, morbid obesity.  Patient presented to the hospital with one week history of right upper quadrant pain, low-grade fever, decreased appetite, induration and erythema of the area surrounding the site where a drain was previously placed following recent lap cholecystectomy and elevated BUN and creatinine. CT of the abdomen and pelvis revealed cellulitis of the abdominal wall surrounding drain site (no gas), umbilical hernia, GERD, and prior gastric bypass surgery.  Hospital Course: Patient was admitted for further assessment and management. The AKI was worked up. Renal ultrasound was normal. Nephrotoxic medications were held, and patient was adequately hydrated. With above management, the AKI has resolved significantly. Serum creatinine prior to discharge was 1.3 (Scr peaked at 2.68). Wound culture eventually MRSA. Patient was initially managed with broad spectrum antibiotics and will be discharged back home on 2 week course of Doxycycline. During the hospital stay, the cellulitic area started draining, but  improved significantly with IV antibiotics. Surgical team directed wound care management.  Procedures:  None.  Consultations:  Surgery  Discharge Exam: Vitals:   09/13/15 2147 09/14/15 0504  BP: 127/63 (!) 143/52  Pulse: 73 70  Resp: 20 18  Temp: 98.3 F (36.8 C) 98 F (36.7 C)    General: Morbidly obese. Awake and alert. Not in distress Cardiovascular: S1S2 Respiratory: Clear to ausculation.  Discharge Instructions   Discharge Instructions    Diet - low sodium heart healthy    Complete by:  As directed   Diet Carb Modified    Complete by:  As directed   Discharge instructions    Complete by:  As directed   Home Health to assist with wound care. Follow up with PCP within one week. Check BMP within one week.   Increase activity slowly    Complete by:  As directed     Current Discharge Medication List    START taking these medications   Details  amLODipine (NORVASC) 5 MG tablet Take 1 tablet (5 mg total) by mouth daily. Qty: 30 tablet, Refills: 1    doxycycline (VIBRAMYCIN) 50 MG capsule Take 2 capsules (100 mg total) by mouth 2 (two) times daily. Qty: 56 capsule, Refills: 0    feeding supplement, ENSURE ENLIVE, (ENSURE ENLIVE) LIQD Take 237 mLs by mouth 2 (two) times daily between meals. Qty: 14220 mL, Refills: 1      CONTINUE these medications which have NOT CHANGED   Details  aspirin EC 81 MG tablet Take 81 mg by mouth daily.    atorvastatin (LIPITOR) 40 MG tablet Take 40 mg by mouth daily.    buPROPion (WELLBUTRIN XL) 300 MG 24 hr tablet  Take 300 mg by mouth daily.    Multiple Vitamin (MULTIVITAMIN WITH MINERALS) TABS tablet Take 1 tablet by mouth daily.    omeprazole (PRILOSEC) 20 MG capsule Take 20 mg by mouth daily.    oxyCODONE-acetaminophen (PERCOCET/ROXICET) 5-325 MG tablet Take 1-2 tablets by mouth every 4 (four) hours as needed for severe pain.    polyethylene glycol (MIRALAX / GLYCOLAX) packet Take 17 g by mouth daily.    rOPINIRole  (REQUIP) 0.5 MG tablet Take 0.5 mg by mouth 3 (three) times daily as needed (restless legs).     vitamin B-12 (CYANOCOBALAMIN) 1000 MCG tablet Take 1,000 mcg by mouth daily.      STOP taking these medications     furosemide (LASIX) 40 MG tablet      lisinopril-hydrochlorothiazide (PRINZIDE,ZESTORETIC) 20-25 MG per tablet      meloxicam (MOBIC) 15 MG tablet      traMADol (ULTRAM) 50 MG tablet      HYDROcodone-acetaminophen (NORCO/VICODIN) 5-325 MG tablet        No Known Allergies    The results of significant diagnostics from this hospitalization (including imaging, microbiology, ancillary and laboratory) are listed below for reference.    Significant Diagnostic Studies: Ct Abdomen Pelvis Wo Contrast  Result Date: 09/09/2015 CLINICAL DATA:  Abdominal pain, generalized. Gallbladder surgery August 18, 2015 EXAM: CT ABDOMEN AND PELVIS WITHOUT CONTRAST TECHNIQUE: Multidetector CT imaging of the abdomen and pelvis was performed following the standard protocol without IV contrast. COMPARISON:  06/17/2015 FINDINGS: Lower chest and abdominal wall: Band of subcutaneous strandy density along the right abdomen. There is no suspected abscess. No soft tissue emphysema. Supraumbilical hernia containing fat and a loop of non edematous small bowel. Distended lower esophagus filled with oral contrast. No superimposed inflammation or wall thickening. Hepatobiliary: No focal liver abnormality.Cholecystectomy. Expected surgical site without evidence of abscess or bile leak. No visible choledocholithiasis. Pancreas: Unremarkable. Spleen: Unremarkable. Adrenals/Urinary Tract: Negative adrenals. No hydronephrosis or stone. Unremarkable bladder. Stomach/Bowel: Postoperative stomach . No inflammation or obstruction. Reproductive:No pathologic findings. Vascular/Lymphatic: Extensive aortic and branch vessel atherosclerotic calcification. No mass or adenopathy. Other: No ascites or pneumoperitoneum. Musculoskeletal:  Advanced and diffuse disc and facet degeneration with spurs causing multi-level lumbar canal and foraminal stenosis. IMPRESSION: 1. Nonspecific abdominal wall edema/inflammation along the cholecystectomy incision. No soft tissue emphysema or suspected abscess. 2. Unremarkable postoperative gallbladder fossa. 3. Distended esophagus from gastroesophageal reflux or poor clearance. 4. Supraumbilical small bowel containing hernia. 5.  Aortic Atherosclerosis (ICD10-170.0) Electronically Signed   By: Marnee SpringJonathon  Watts M.D.   On: 09/09/2015 20:15   Dg Chest 2 View  Result Date: 09/09/2015 CLINICAL DATA:  Right lower quadrant abdominal pain since cholecystectomy, cough, fever EXAM: CHEST  2 VIEW COMPARISON:  CTA chest dated 12/21/2014 FINDINGS: Lungs are clear.  No pleural effusion or pneumothorax. The heart is normal in size. Mild degenerative changes of the visualized thoracolumbar spine. IMPRESSION: Normal chest radiographs. Electronically Signed   By: Charline BillsSriyesh  Krishnan M.D.   On: 09/09/2015 18:17   [place] Renal  Result Date: 09/10/2015 CLINICAL DATA:  Acute kidney injury EXAM: RENAL / URINARY TRACT ULTRASOUND COMPLETE COMPARISON:  None. FINDINGS: Right Kidney: Length: 11.9 cm. Echogenicity within normal limits. No mass or hydronephrosis visualized. Left Kidney: Length: 12.9 cm. Echogenicity within normal limits. No mass or hydronephrosis visualized. Bladder: Appears normal for degree of bladder distention. IMPRESSION: Normal bilateral kidneys. Electronically Signed   By: Elige KoHetal  Patel   On: 09/10/2015 14:19    Microbiology: Recent Results (from the  past 240 hour(s))  Culture, blood (routine x 2)     Status: None (Preliminary result)   Collection Time: 09/10/15 12:49 AM  Result Value Ref Range Status   Specimen Description BLOOD LEFT ANTECUBITAL  Final   Special Requests BOTTLES DRAWN AEROBIC AND ANAEROBIC 5CC  Final   Culture   Final    NO GROWTH 4 DAYS Performed at [facility]    Report Status  PENDING  Incomplete  Culture, blood (routine x 2)     Status: None (Preliminary result)   Collection Time: 09/10/15 12:50 AM  Result Value Ref Range Status   Specimen Description BLOOD RIGHT HAND  Final   Special Requests BOTTLES DRAWN AEROBIC AND ANAEROBIC  Final   Culture   Final    NO GROWTH 4 DAYS Performed at [facility]    Report Status PENDING  Incomplete  Aerobic/Anaerobic Culture (surgical/deep wound)     Status: None (Preliminary result)   Collection Time: 09/12/15 11:09 AM  Result Value Ref Range Status   Specimen Description WOUND DRAINAGE  Final   Special Requests NONE  Final   Gram Stain   Final    FEW WBC PRESENT,BOTH PMN AND MONONUCLEAR FEW GRAM POSITIVE COCCI Performed at [facility]    Culture   Final    ABUNDANT METHICILLIN RESISTANT STAPHYLOCOCCUS AUREUS NO ANAEROBES ISOLATED; CULTURE IN PROGRESS FOR 5 DAYS    Report Status PENDING  Incomplete   Organism ID, Bacteria METHICILLIN RESISTANT STAPHYLOCOCCUS AUREUS  Final      Susceptibility   Methicillin resistant staphylococcus aureus - MIC*    CIPROFLOXACIN >=8 RESISTANT Resistant     ERYTHROMYCIN 0.5 SENSITIVE Sensitive     GENTAMICIN <=0.5 SENSITIVE Sensitive     OXACILLIN >=4 RESISTANT Resistant     TETRACYCLINE <=1 SENSITIVE Sensitive     VANCOMYCIN 1 SENSITIVE Sensitive     TRIMETH/SULFA <=10 SENSITIVE Sensitive     CLINDAMYCIN <=0.25 SENSITIVE Sensitive     RIFAMPIN <=0.5 SENSITIVE Sensitive     Inducible Clindamycin NEGATIVE Sensitive     * ABUNDANT METHICILLIN RESISTANT STAPHYLOCOCCUS AUREUS     Labs: Basic Metabolic Panel:  Recent Labs Lab 09/09/15 1447 09/10/15 0419 09/11/15 0415 09/12/15 0433 09/13/15 0947  NA 134* 134* 134* 134* 134*  K 3.7 3.7 3.4* 3.5 3.8  CL 98* 99* 99* 101 104  CO2 24 25 25 24 24   GLUCOSE 112* 110* 134* 117* 120*  BUN 28* 39* 44* 43* 29*  CREATININE 2.28* 2.66* 2.68* 1.99* 1.31*  CALCIUM 8.7* 8.1* 8.1* 8.0* 8.1*  PHOS  --   --  3.9 3.2  2.3*   Liver Function Tests:  Recent Labs Lab 09/09/15 1447 09/10/15 0419 09/11/15 0415 09/12/15 0433 09/13/15 0947  AST 30 34  --   --   --   ALT 20 24  --   --   --   ALKPHOS 77 66  --   --   --   BILITOT 0.8 0.7  --   --   --   PROT 6.8 6.3*  --   --   --   ALBUMIN 3.0* 2.7* 2.8* 2.7* 2.6*   No results for input(s): LIPASE, AMYLASE in the last 168 hours. No results for input(s): AMMONIA in the last 168 hours. CBC:  Recent Labs Lab 09/09/15 1447 09/10/15 0419 09/11/15 0415 09/12/15 0433 09/13/15 0947  WBC 17.4* 13.9* 13.0* 9.1 6.7  NEUTROABS 15.4*  --  11.1* 7.5 5.3  HGB 9.1* 7.7* 8.0* 8.0* 8.0*  HCT 28.7* 23.8* 24.8* 24.5* 24.9*  MCV 92.6 91.9 89.2 90.4 91.5  PLT 258 219 241 261 289   Cardiac Enzymes: No results for input(s): CKTOTAL, CKMB, CKMBINDEX, TROPONINI in the last 168 hours. BNP: BNP (last 3 results) No results for input(s): BNP in the last 8760 hours.  ProBNP (last 3 results) No results for input(s): PROBNP in the last 8760 hours.  CBG: No results for input(s): GLUCAP in the last 168 hours.     Signed:  Berton Mount, MD  [facility] Pager #: [phone] 7PM-7AM contact night coverage as above

## 2015-09-14 NOTE — Progress Notes (Signed)
Subjective: Main complaint is of a headache.  Less RUQ pain when getting up.  Objective: Vital signs in last 24 hours: Temp:  [98 F (36.7 C)-98.3 F (36.8 C)] 98 F (36.7 C) (08/29 0504) Pulse Rate:  [70-77] 70 (08/29 0504) Resp:  [18-20] 18 (08/29 0504) BP: (127-143)/(52-65) 143/52 (08/29 0504) SpO2:  [100 %] 100 % (08/29 0504) Last BM Date: 09/12/15  Intake/Output from previous day: 08/28 0701 - 08/29 0700 In: 3480 [P.O.:840; I.V.:2590; IV Piggyback:50] Out: 1950 [Urine:1950] Intake/Output this shift: No intake/output data recorded.  PE: General- In NAD Abdomen-soft, drain site wound draining purulent fluid; erythema and induration resolved; some superficial opening of subumbilical wound.  Lab Results:   Recent Labs  09/12/15 0433 09/13/15 0947  WBC 9.1 6.7  HGB 8.0* 8.0*  HCT 24.5* 24.9*  PLT 261 289   BMET  Recent Labs  09/12/15 0433 09/13/15 0947  NA 134* 134*  K 3.5 3.8  CL 101 104  CO2 24 24  GLUCOSE 117* 120*  BUN 43* 29*  CREATININE 1.99* 1.31*  CALCIUM 8.0* 8.1*   PT/INR No results for input(s): LABPROT, INR in the last 72 hours. Comprehensive Metabolic Panel:    Component Value Date/Time   NA 134 (L) 09/13/2015 0947   NA 134 (L) 09/12/2015 0433   K 3.8 09/13/2015 0947   K 3.5 09/12/2015 0433   CL 104 09/13/2015 0947   CL 101 09/12/2015 0433   CO2 24 09/13/2015 0947   CO2 24 09/12/2015 0433   BUN 29 (H) 09/13/2015 0947   BUN 43 (H) 09/12/2015 0433   CREATININE 1.31 (H) 09/13/2015 0947   CREATININE 1.99 (H) 09/12/2015 0433   GLUCOSE 120 (H) 09/13/2015 0947   GLUCOSE 117 (H) 09/12/2015 0433   CALCIUM 8.1 (L) 09/13/2015 0947   CALCIUM 8.0 (L) 09/12/2015 0433   AST 34 09/10/2015 0419   AST 30 09/09/2015 1447   ALT 24 09/10/2015 0419   ALT 20 09/09/2015 1447   ALKPHOS 66 09/10/2015 0419   ALKPHOS 77 09/09/2015 1447   BILITOT 0.7 09/10/2015 0419   BILITOT 0.8 09/09/2015 1447   PROT 6.3 (L) 09/10/2015 0419   PROT 6.8 09/09/2015  1447   ALBUMIN 2.6 (L) 09/13/2015 0947   ALBUMIN 2.7 (L) 09/12/2015 0433     Studies/Results: No results found.  Anti-infectives: Anti-infectives    Start     Dose/Rate Route Frequency Ordered Stop   09/10/15 0400  cefTRIAXone (ROCEPHIN) 2 g in dextrose 5 % 50 mL IVPB     2 g 100 mL/hr over 30 Minutes Intravenous Every 24 hours 09/10/15 0257     09/10/15 0230  cefTRIAXone (ROCEPHIN) 1 g in dextrose 5 % 50 mL IVPB  Status:  Discontinued     1 g 100 mL/hr over 30 Minutes Intravenous Every 24 hours 09/10/15 0220 09/10/15 0257   09/10/15 0045  ceFAZolin (ANCEF) IVPB 1 g/50 mL premix  Status:  Discontinued     1 g 100 mL/hr over 30 Minutes Intravenous  Once 09/10/15 0034 09/10/15 0220   09/10/15 0030  ceFAZolin (ANCEF) 3 g in dextrose 5 % 50 mL IVPB  Status:  Discontinued     3 g 130 mL/hr over 30 Minutes Intravenous Every 8 hours 09/09/15 2355 09/10/15 0220   09/10/15 0000  metroNIDAZOLE (FLAGYL) IVPB 500 mg  Status:  Discontinued     500 mg 100 mL/hr over 60 Minutes Intravenous Every 6 hours 09/09/15 2355 09/10/15 0259   09/09/15 2230  cephALEXin (KEFLEX) capsule 500 mg  Status:  Discontinued     500 mg Oral  Once 09/09/15 2216 09/09/15 2223   09/09/15 2230  sulfamethoxazole-trimethoprim (BACTRIM DS,SEPTRA DS) 800-160 MG per tablet 1 tablet  Status:  Discontinued     1 tablet Oral  Once 09/09/15 2216 09/09/15 2223   09/09/15 0000  cephALEXin (KEFLEX) 500 MG capsule  Status:  Discontinued     500 mg Oral 4 times daily 09/09/15 2218 09/10/15    09/09/15 0000  sulfamethoxazole-trimethoprim (BACTRIM DS,SEPTRA DS) 800-160 MG tablet  Status:  Discontinued     1 tablet Oral 2 times daily 09/09/15 2218 09/10/15       Assessment Principal Problem:   Staph aureus drain site wound infection-improving   AKI-improving    LOS: 4 days   Plan: Okay to discharge on 2 weeks of Doxycycline from my standpoint.  Use 1/4 inch nugauze for daily dressing change.  Follow up in office in one  week.   Mansur Patti Shela Commons 09/14/2015

## 2015-09-14 NOTE — Care Management Note (Signed)
Case Management Note  Patient Details  Name: Gary [name] MRN: [id number] Date of Birth: [birth year]  Subjective/Objective:  65 yo admitted with Abdominal wall cellulitis.                  Action/Plan: From home alone. [facility]. [facility] rep contacted for referral and MD order received. Pt states he is willing to learn dressing changes. [facility] made aware. No other CM needs communicated. Expected Discharge Date:                  Expected Discharge Plan:  Home w Home Health Services  In-House Referral:     Discharge planning Services  CM Consult  Post Acute Care Choice:  Home Health Choice offered to:  Patient  DME Arranged:    DME Agency:     HH Arranged:  RN HH Agency:  [facility]  Status of Service:  In process, will continue to follow  If discussed at Long Length of Stay Meetings, dates discussed:    Additional CommentsBartholome Bill:  [name], RN 09/14/2015, 11:02 AM  [phone]

## 2015-09-15 LAB — CULTURE, BLOOD (ROUTINE X 2)
CULTURE: NO GROWTH
Culture: NO GROWTH

## 2015-09-17 LAB — AEROBIC/ANAEROBIC CULTURE W GRAM STAIN (SURGICAL/DEEP WOUND)

## 2015-09-17 LAB — AEROBIC/ANAEROBIC CULTURE (SURGICAL/DEEP WOUND)

## 2016-02-14 ENCOUNTER — Encounter: Payer: Self-pay | Admitting: Gastroenterology

## 2016-03-21 ENCOUNTER — Ambulatory Visit (INDEPENDENT_AMBULATORY_CARE_PROVIDER_SITE_OTHER): Payer: BLUE CROSS/BLUE SHIELD | Admitting: Gastroenterology

## 2016-03-21 ENCOUNTER — Encounter: Payer: Self-pay | Admitting: Gastroenterology

## 2016-03-21 VITALS — BP 118/60 | HR 74 | Ht 69.0 in | Wt 379.0 lb

## 2016-03-21 DIAGNOSIS — R1013 Epigastric pain: Secondary | ICD-10-CM | POA: Diagnosis not present

## 2016-03-21 DIAGNOSIS — G8929 Other chronic pain: Secondary | ICD-10-CM

## 2016-03-21 DIAGNOSIS — R1310 Dysphagia, unspecified: Secondary | ICD-10-CM

## 2016-03-21 DIAGNOSIS — R1319 Other dysphagia: Secondary | ICD-10-CM

## 2016-03-21 DIAGNOSIS — G4733 Obstructive sleep apnea (adult) (pediatric): Secondary | ICD-10-CM | POA: Diagnosis not present

## 2016-03-21 DIAGNOSIS — K219 Gastro-esophageal reflux disease without esophagitis: Secondary | ICD-10-CM | POA: Diagnosis not present

## 2016-03-21 NOTE — Progress Notes (Signed)
[facility] Consult Note:  History: Gary [name] 03/21/2016  Referring physician: Avel Peace, MD  Reason for consult/chief complaint: Dysphagia (onset x yrs, globus sensation, trouble with liquids and solids)   Subjective  HPI:  This is a 66 year old man referred by Dr. Suzzanne Cloud of the surgical service for abdominal pain. Patient describes several years of a globus sensation as well as feeling "full from my neck to my abdomen". He also describes a bandlike upper abdominal pain that is fairly constant and at times has episodic worsening but without clear triggers. It was not changed after cholecystectomy 6 months ago. He says that pain was somewhat different than this. He has a long-standing upper GI history with a banded gastroplasty in the 1980s: Follow-up for many years by Dr.Orr.  I have an upper endoscopy report from 2004 where they were able to pass the scope to the duodenum, and another exam from 2009 with the scope could only be passed to the gastric body, presumably due to the postsurgical anatomy. Reports of pouch abnormalities or ulcer. I don't see any biopsies taken to know his H. pylori status.  Gary [name] also seems to describe dysphagia to both solids and liquids and regurgitation especially at night. He feels like many of the symptoms were improved for many years on acid suppression, but that no longer seems to be working very well.  ROS:  Review of Systems  Constitutional: Positive for fatigue. Negative for appetite change and unexpected weight change.  HENT: Negative for mouth sores and voice change.   Eyes: Negative for pain and redness.  Respiratory: Positive for shortness of breath. Negative for cough.   Cardiovascular: Positive for leg swelling. Negative for chest pain and palpitations.  Genitourinary: Negative for dysuria and hematuria.  Musculoskeletal: Positive for arthralgias and back pain. Negative for myalgias.  Skin: Negative for pallor and  rash.  Neurological: Negative for weakness and headaches.  Hematological: Negative for adenopathy.  Psychiatric/Behavioral: Positive for dysphoric mood.     Past Medical History: Past Medical History:  Diagnosis Date  . Anxiety   . CAD (coronary artery disease), native coronary artery 09/07/2014   3 vessel coronary calcification noted on CT scan 2016   . Depression   . Diastolic dysfunction   . Dysrhythmia    hX A-Fib  . GERD (gastroesophageal reflux disease)   . History of hiatal hernia   . Hypertension   . Hypertensive heart disease without CHF   . Morbid obesity (HCC)   . Sleep apnea    does not use or have a cpap-he is supposed to use one     Past Surgical History: Past Surgical History:  Procedure Laterality Date  . CARDIAC CATHETERIZATION N/A 09/23/2014   Procedure: Left Heart Cath and Coronary Angiography;  Surgeon: Kathleene Hazel, MD;  Location: [facility] INVASIVE CV LAB;  Service: Cardiovascular;  Laterality: N/A;  . CHOLECYSTECTOMY N/A 08/18/2015   Procedure: LAPAROSCOPIC CHOLECYSTECTOMY;  Surgeon: Avel Peace, MD;  Location: [facility] ORS;  Service: General;  Laterality: N/A;  . COLONOSCOPY    . GASTRIC BYPASS  1982  . MOUTH SURGERY    . UPPER GI ENDOSCOPY     Cardiac catheterization report from 2016 was reviewed. Three-vessel coronary disease, no intervention required.   I reviewed the surgical report from cholecystectomy. There were some minor adhesions from the omentum to the gallbladder, but otherwise no reported anatomical abnormalities or major adhesions. There was apparently a nonobstructing umbilical hernia. It should be noted that the visualization  during laparoscopy was limited due to the patient's morbid obesity. Family History: Family History  Problem Relation Age of Onset  . Heart attack Father   . Diabetes Father   . Heart failure Mother   . Pancreatic cancer Mother   . Multiple sclerosis Sister   . Diabetes Maternal Grandmother   . Heart attack  Maternal Grandfather   . Diabetes Paternal Grandmother   . Heart attack Paternal Grandfather   . Hypertension Brother   . Cancer - Colon Brother 48  . Hypertension Maternal Aunt     Social History: Social History   Social History  . Marital status: Single    Spouse name: N/A  . Number of children: 0  . Years of education: 10   Occupational History  . retired     Biomedical engineer   Social History Main Topics  . Smoking status: Former Smoker    Types: Cigars    Quit date: 01/16/2006  . Smokeless tobacco: Never Used  . Alcohol use 0.0 oz/week     Comment: occ  . Drug use: No  . Sexual activity: Not Asked   Other Topics Concern  . None   Social History Narrative   Lives alone.  Chartered certified accountant for [employer].    Caffeine use- coffee 2 cups /day    Allergies: No Known Allergies  Outpatient Meds: Current Outpatient Prescriptions  Medication Sig Dispense Refill  . aspirin EC 81 MG tablet Take 81 mg by mouth daily.    Marland Kitchen atorvastatin (LIPITOR) 40 MG tablet Take 40 mg by mouth daily.    Marland Kitchen buPROPion (WELLBUTRIN XL) 150 MG 24 hr tablet Take 450 mg by mouth daily.    . ferrous sulfate 325 (65 FE) MG tablet Take 325 mg by mouth daily with breakfast.    . furosemide (LASIX) 40 MG tablet Take by mouth. Take 1/2 tablet every other day    . lisinopril-hydrochlorothiazide (PRINZIDE,ZESTORETIC) 20-25 MG tablet Take 1 tablet by mouth daily.    . Multiple Vitamin (MULTIVITAMIN WITH MINERALS) TABS tablet Take 1 tablet by mouth daily.    Marland Kitchen omeprazole (PRILOSEC) 40 MG capsule Take 40 mg by mouth 2 (two) times daily.    Marland Kitchen rOPINIRole (REQUIP) 0.5 MG tablet Take 0.5 mg by mouth 3 (three) times daily as needed (restless legs).     . traMADol (ULTRAM) 50 MG tablet Take 100 mg by mouth every 6 (six) hours as needed.     No current facility-administered medications for this visit.       ___________________________________________________________________ Objective   Exam:  BP 118/60 Comment:  large cuff  Pulse 74   Ht 5\' 9"  (1.753 m)   Wt (!) 379 lb (171.9 kg)   BMI 55.97 kg/m    General: this is a(n) Morbidly obese middle-aged man   Eyes: sclera anicteric, no redness  ENT: oral mucosa moist without lesions, no cervical or supraclavicular lymphadenopathy, good dentition  CV: RRR without murmur, S1/S2, no JVD, no peripheral edema  Resp: clear to auscultation bilaterally, normal RR and effort noted  GI: soft, no tenderness, with active bowel sounds. No guarding or palpable organomegaly noted.  Skin; warm and dry, no rash or jaundice noted  Neuro: awake, alert and oriented x 3. Normal gross motor function and fluent speech  Labs:  Endoscopic reports as noted above. One of the EGD reports suggest possible Barrett's esophagus, but no intestinal metaplasia seen on 2000 for biopsy results 2009 colonoscopy revealed hyperplastic polyp. EGD biopsy at that time also  showed no intestinal metaplasia  Upper GI series from March 2010 oh his postsurgical anatomy and was otherwise normal.  Assessment: Encounter Diagnoses  Name Primary?  . Abdominal pain, chronic, epigastric Yes  . Esophageal dysphagia   . Gastroesophageal reflux disease, esophagitis presence not specified   . Obstructive sleep apnea syndrome   . Morbid obesity (HCC)     The abdominal pain is difficult to characterize. I'm not certain that he has a mucosal cause for it, but given its constant nature, reported worsening over the last year, and many years duration, I think an upper endoscopy is warranted to at least make sure it is nothing harmful.  His reflux symptoms seem likely related to previous surgery and perhaps delayed upper stomach emptying test abated by morbid obesity. I suggested he change in eating habits not deep within several hours of bedtime and also get a foam bed wedge to elevate his head of bed at night.  Plan:  Upper endoscopy Must be done at the hospital due to his BMI Increased risk  procedure medical comorbidities   The benefits and risks of the planned procedure were described in detail with the patient or (when appropriate) their health care proxy.  Risks were outlined as including, but not limited to, bleeding, infection, perforation, adverse medication reaction leading to cardiac or pulmonary decompensation, or pancreatitis (if ERCP).  The limitation of incomplete mucosal visualization was also discussed.  No guarantees or warranties were given.   Thank you for the courtesy of this consult.  Please call me with any questions or concerns.  Charlie PitterHenry L Danis III  CC: Londell MohPHARR,[name] DAVIDSON, MD  Avel Peaceodd Rosenbower, MD

## 2016-03-21 NOTE — Patient Instructions (Signed)
If you are age 65 or older, your body mass index should be between 23-30. Your Body mass index is 55.97 kg/m. If this is out of the aforementioned range listed, please consider follow up with your Primary Care Provider.  If you are age 65 or younger, your body mass index should be between 19-25. Your Body mass index is 55.97 kg/m. If this is out of the aformentioned range listed, please consider follow up with your Primary Care Provider.   You have been scheduled for an endoscopy. Please follow written instructions given to you at your visit today. If you use inhalers (even only as needed), please bring them with you on the day of your procedure. Your physician has requested that you go to www.startemmi.com and enter the access code given to you at your visit today. This web site gives a general overview about your procedure. However, you should still follow specific instructions given to you by our office regarding your preparation for the procedure.  Thank you for choosing [facility]  Dr Amada JupiterHenry Danis III

## 2016-05-01 ENCOUNTER — Telehealth: Payer: Self-pay

## 2016-05-01 NOTE — Telephone Encounter (Signed)
Left message for patient with information about [facility] who provides rides for patient to and from procedures. Asked that he call them and then call our office back to let me know, I have not cancelled the 4/19 procedure yet.

## 2016-05-01 NOTE — Telephone Encounter (Signed)
Called patient back, he has not even tried to reach [facility], he doesn't feel comfortable with someone else taking him home. Patient's brother had something come up and he will not be available to bring patient to/from procedure on 4/19. Called [facility] endo, spoke to [facility] and cancelled procedure. Patient said that he will call back to reschedule. Advised patient that it could take several weeks to get another time and he was willing to wait.

## 2016-05-01 NOTE — Telephone Encounter (Addendum)
Understood.  That is his choice. Thankfully then procedure is not urgent, because I will not have another hospital outpatient endoscopy slot available for over a month.

## 2016-05-04 ENCOUNTER — Ambulatory Visit (HOSPITAL_COMMUNITY): Admit: 2016-05-04 | Payer: BLUE CROSS/BLUE SHIELD | Admitting: Gastroenterology

## 2016-05-04 ENCOUNTER — Encounter (HOSPITAL_COMMUNITY): Payer: Self-pay

## 2016-05-04 SURGERY — ESOPHAGOGASTRODUODENOSCOPY (EGD) WITH PROPOFOL
Anesthesia: Monitor Anesthesia Care

## 2016-05-11 DIAGNOSIS — I2510 Atherosclerotic heart disease of native coronary artery without angina pectoris: Secondary | ICD-10-CM | POA: Diagnosis not present

## 2016-05-11 DIAGNOSIS — I480 Paroxysmal atrial fibrillation: Secondary | ICD-10-CM | POA: Diagnosis not present

## 2016-05-11 DIAGNOSIS — I119 Hypertensive heart disease without heart failure: Secondary | ICD-10-CM | POA: Diagnosis not present

## 2016-05-11 DIAGNOSIS — I5032 Chronic diastolic (congestive) heart failure: Secondary | ICD-10-CM | POA: Diagnosis not present

## 2016-05-25 DIAGNOSIS — M170 Bilateral primary osteoarthritis of knee: Secondary | ICD-10-CM | POA: Diagnosis not present

## 2016-05-25 DIAGNOSIS — M19042 Primary osteoarthritis, left hand: Secondary | ICD-10-CM | POA: Diagnosis not present

## 2016-05-25 DIAGNOSIS — M79643 Pain in unspecified hand: Secondary | ICD-10-CM | POA: Diagnosis not present

## 2016-05-25 DIAGNOSIS — M19041 Primary osteoarthritis, right hand: Secondary | ICD-10-CM | POA: Diagnosis not present

## 2016-05-25 DIAGNOSIS — M150 Primary generalized (osteo)arthritis: Secondary | ICD-10-CM | POA: Diagnosis not present

## 2016-05-25 DIAGNOSIS — M25569 Pain in unspecified knee: Secondary | ICD-10-CM | POA: Diagnosis not present

## 2016-06-05 DIAGNOSIS — G4733 Obstructive sleep apnea (adult) (pediatric): Secondary | ICD-10-CM | POA: Diagnosis not present

## 2016-06-06 DIAGNOSIS — Z79899 Other long term (current) drug therapy: Secondary | ICD-10-CM | POA: Diagnosis not present

## 2016-06-06 DIAGNOSIS — M1710 Unilateral primary osteoarthritis, unspecified knee: Secondary | ICD-10-CM | POA: Diagnosis not present

## 2016-06-06 DIAGNOSIS — M25561 Pain in right knee: Secondary | ICD-10-CM | POA: Diagnosis not present

## 2016-06-07 DIAGNOSIS — M170 Bilateral primary osteoarthritis of knee: Secondary | ICD-10-CM | POA: Diagnosis not present

## 2016-06-07 DIAGNOSIS — Z6841 Body Mass Index (BMI) 40.0 and over, adult: Secondary | ICD-10-CM | POA: Diagnosis not present

## 2016-06-13 DIAGNOSIS — M25561 Pain in right knee: Secondary | ICD-10-CM | POA: Diagnosis not present

## 2016-06-13 DIAGNOSIS — Z79899 Other long term (current) drug therapy: Secondary | ICD-10-CM | POA: Diagnosis not present

## 2016-06-13 DIAGNOSIS — I10 Essential (primary) hypertension: Secondary | ICD-10-CM | POA: Diagnosis not present

## 2016-06-13 DIAGNOSIS — M1710 Unilateral primary osteoarthritis, unspecified knee: Secondary | ICD-10-CM | POA: Diagnosis not present

## 2016-06-15 DIAGNOSIS — I10 Essential (primary) hypertension: Secondary | ICD-10-CM | POA: Diagnosis not present

## 2016-06-15 DIAGNOSIS — M5432 Sciatica, left side: Secondary | ICD-10-CM | POA: Diagnosis not present

## 2016-06-15 DIAGNOSIS — G4733 Obstructive sleep apnea (adult) (pediatric): Secondary | ICD-10-CM | POA: Diagnosis not present

## 2016-06-15 DIAGNOSIS — F339 Major depressive disorder, recurrent, unspecified: Secondary | ICD-10-CM | POA: Diagnosis not present

## 2016-06-20 DIAGNOSIS — M25562 Pain in left knee: Secondary | ICD-10-CM | POA: Diagnosis not present

## 2016-06-20 DIAGNOSIS — G8929 Other chronic pain: Secondary | ICD-10-CM | POA: Diagnosis not present

## 2016-06-20 DIAGNOSIS — M170 Bilateral primary osteoarthritis of knee: Secondary | ICD-10-CM | POA: Diagnosis not present

## 2016-06-20 DIAGNOSIS — M25561 Pain in right knee: Secondary | ICD-10-CM | POA: Diagnosis not present

## 2016-06-21 DIAGNOSIS — M5432 Sciatica, left side: Secondary | ICD-10-CM | POA: Diagnosis not present

## 2016-06-21 DIAGNOSIS — M5137 Other intervertebral disc degeneration, lumbosacral region: Secondary | ICD-10-CM | POA: Diagnosis not present

## 2016-06-21 DIAGNOSIS — M9905 Segmental and somatic dysfunction of pelvic region: Secondary | ICD-10-CM | POA: Diagnosis not present

## 2016-06-21 DIAGNOSIS — M9903 Segmental and somatic dysfunction of lumbar region: Secondary | ICD-10-CM | POA: Diagnosis not present

## 2016-06-22 DIAGNOSIS — M9905 Segmental and somatic dysfunction of pelvic region: Secondary | ICD-10-CM | POA: Diagnosis not present

## 2016-06-22 DIAGNOSIS — M5432 Sciatica, left side: Secondary | ICD-10-CM | POA: Diagnosis not present

## 2016-06-22 DIAGNOSIS — M5137 Other intervertebral disc degeneration, lumbosacral region: Secondary | ICD-10-CM | POA: Diagnosis not present

## 2016-06-22 DIAGNOSIS — M5442 Lumbago with sciatica, left side: Secondary | ICD-10-CM | POA: Diagnosis not present

## 2016-06-22 DIAGNOSIS — M9903 Segmental and somatic dysfunction of lumbar region: Secondary | ICD-10-CM | POA: Diagnosis not present

## 2016-06-23 DIAGNOSIS — G4733 Obstructive sleep apnea (adult) (pediatric): Secondary | ICD-10-CM | POA: Diagnosis not present

## 2016-07-03 DIAGNOSIS — M9903 Segmental and somatic dysfunction of lumbar region: Secondary | ICD-10-CM | POA: Diagnosis not present

## 2016-07-03 DIAGNOSIS — M5432 Sciatica, left side: Secondary | ICD-10-CM | POA: Diagnosis not present

## 2016-07-03 DIAGNOSIS — M5137 Other intervertebral disc degeneration, lumbosacral region: Secondary | ICD-10-CM | POA: Diagnosis not present

## 2016-07-03 DIAGNOSIS — M9905 Segmental and somatic dysfunction of pelvic region: Secondary | ICD-10-CM | POA: Diagnosis not present

## 2016-07-13 DIAGNOSIS — M1710 Unilateral primary osteoarthritis, unspecified knee: Secondary | ICD-10-CM | POA: Diagnosis not present

## 2016-07-13 DIAGNOSIS — M25561 Pain in right knee: Secondary | ICD-10-CM | POA: Diagnosis not present

## 2016-07-13 DIAGNOSIS — Z79899 Other long term (current) drug therapy: Secondary | ICD-10-CM | POA: Diagnosis not present

## 2016-07-23 DIAGNOSIS — G4733 Obstructive sleep apnea (adult) (pediatric): Secondary | ICD-10-CM | POA: Diagnosis not present

## 2016-07-24 DIAGNOSIS — G8929 Other chronic pain: Secondary | ICD-10-CM | POA: Diagnosis not present

## 2016-07-24 DIAGNOSIS — M25561 Pain in right knee: Secondary | ICD-10-CM | POA: Diagnosis not present

## 2016-07-24 DIAGNOSIS — M25562 Pain in left knee: Secondary | ICD-10-CM | POA: Diagnosis not present

## 2016-07-24 DIAGNOSIS — M5442 Lumbago with sciatica, left side: Secondary | ICD-10-CM | POA: Diagnosis not present

## 2016-08-01 DIAGNOSIS — H5201 Hypermetropia, right eye: Secondary | ICD-10-CM | POA: Diagnosis not present

## 2016-08-22 DIAGNOSIS — Z79899 Other long term (current) drug therapy: Secondary | ICD-10-CM | POA: Diagnosis not present

## 2016-08-22 DIAGNOSIS — M25561 Pain in right knee: Secondary | ICD-10-CM | POA: Diagnosis not present

## 2016-08-22 DIAGNOSIS — M1710 Unilateral primary osteoarthritis, unspecified knee: Secondary | ICD-10-CM | POA: Diagnosis not present

## 2016-08-23 DIAGNOSIS — G4733 Obstructive sleep apnea (adult) (pediatric): Secondary | ICD-10-CM | POA: Diagnosis not present

## 2016-09-07 DIAGNOSIS — G2581 Restless legs syndrome: Secondary | ICD-10-CM | POA: Diagnosis not present

## 2016-09-07 DIAGNOSIS — F339 Major depressive disorder, recurrent, unspecified: Secondary | ICD-10-CM | POA: Diagnosis not present

## 2016-10-04 DIAGNOSIS — M170 Bilateral primary osteoarthritis of knee: Secondary | ICD-10-CM | POA: Diagnosis not present

## 2016-10-04 DIAGNOSIS — M25562 Pain in left knee: Secondary | ICD-10-CM | POA: Diagnosis not present

## 2016-10-04 DIAGNOSIS — G8929 Other chronic pain: Secondary | ICD-10-CM | POA: Diagnosis not present

## 2016-10-04 DIAGNOSIS — M25561 Pain in right knee: Secondary | ICD-10-CM | POA: Diagnosis not present

## 2016-10-18 DIAGNOSIS — M1711 Unilateral primary osteoarthritis, right knee: Secondary | ICD-10-CM | POA: Diagnosis not present

## 2016-10-18 DIAGNOSIS — M1712 Unilateral primary osteoarthritis, left knee: Secondary | ICD-10-CM | POA: Diagnosis not present

## 2016-12-18 DIAGNOSIS — I10 Essential (primary) hypertension: Secondary | ICD-10-CM | POA: Diagnosis not present

## 2016-12-18 DIAGNOSIS — Z125 Encounter for screening for malignant neoplasm of prostate: Secondary | ICD-10-CM | POA: Diagnosis not present

## 2016-12-21 DIAGNOSIS — F1190 Opioid use, unspecified, uncomplicated: Secondary | ICD-10-CM | POA: Diagnosis not present

## 2016-12-21 DIAGNOSIS — I10 Essential (primary) hypertension: Secondary | ICD-10-CM | POA: Diagnosis not present

## 2016-12-21 DIAGNOSIS — R0609 Other forms of dyspnea: Secondary | ICD-10-CM | POA: Diagnosis not present

## 2016-12-21 DIAGNOSIS — D649 Anemia, unspecified: Secondary | ICD-10-CM | POA: Diagnosis not present

## 2016-12-21 DIAGNOSIS — G2581 Restless legs syndrome: Secondary | ICD-10-CM | POA: Diagnosis not present

## 2016-12-21 DIAGNOSIS — Z0001 Encounter for general adult medical examination with abnormal findings: Secondary | ICD-10-CM | POA: Diagnosis not present

## 2016-12-21 DIAGNOSIS — Z5181 Encounter for therapeutic drug level monitoring: Secondary | ICD-10-CM | POA: Diagnosis not present

## 2017-01-03 DIAGNOSIS — M170 Bilateral primary osteoarthritis of knee: Secondary | ICD-10-CM | POA: Diagnosis not present

## 2017-01-25 DIAGNOSIS — I10 Essential (primary) hypertension: Secondary | ICD-10-CM | POA: Diagnosis not present

## 2017-01-25 DIAGNOSIS — I890 Lymphedema, not elsewhere classified: Secondary | ICD-10-CM | POA: Diagnosis not present

## 2017-01-25 DIAGNOSIS — N183 Chronic kidney disease, stage 3 (moderate): Secondary | ICD-10-CM | POA: Diagnosis not present

## 2017-01-25 DIAGNOSIS — I119 Hypertensive heart disease without heart failure: Secondary | ICD-10-CM | POA: Diagnosis not present

## 2017-01-31 DIAGNOSIS — E7800 Pure hypercholesterolemia, unspecified: Secondary | ICD-10-CM | POA: Diagnosis not present

## 2017-01-31 DIAGNOSIS — I890 Lymphedema, not elsewhere classified: Secondary | ICD-10-CM | POA: Diagnosis not present

## 2017-01-31 DIAGNOSIS — I10 Essential (primary) hypertension: Secondary | ICD-10-CM | POA: Diagnosis not present

## 2017-01-31 DIAGNOSIS — I471 Supraventricular tachycardia: Secondary | ICD-10-CM | POA: Diagnosis not present

## 2017-02-08 DIAGNOSIS — I890 Lymphedema, not elsewhere classified: Secondary | ICD-10-CM | POA: Diagnosis not present

## 2017-02-08 DIAGNOSIS — N183 Chronic kidney disease, stage 3 (moderate): Secondary | ICD-10-CM | POA: Diagnosis not present

## 2017-02-14 DIAGNOSIS — N183 Chronic kidney disease, stage 3 (moderate): Secondary | ICD-10-CM | POA: Diagnosis not present

## 2017-02-14 DIAGNOSIS — I10 Essential (primary) hypertension: Secondary | ICD-10-CM | POA: Diagnosis not present

## 2017-03-06 DIAGNOSIS — I890 Lymphedema, not elsewhere classified: Secondary | ICD-10-CM | POA: Diagnosis not present

## 2017-03-20 DIAGNOSIS — M2550 Pain in unspecified joint: Secondary | ICD-10-CM | POA: Diagnosis not present

## 2017-03-20 DIAGNOSIS — G4733 Obstructive sleep apnea (adult) (pediatric): Secondary | ICD-10-CM | POA: Diagnosis not present

## 2017-03-20 DIAGNOSIS — F339 Major depressive disorder, recurrent, unspecified: Secondary | ICD-10-CM | POA: Diagnosis not present

## 2017-03-20 DIAGNOSIS — I890 Lymphedema, not elsewhere classified: Secondary | ICD-10-CM | POA: Diagnosis not present

## 2017-03-29 DIAGNOSIS — M150 Primary generalized (osteo)arthritis: Secondary | ICD-10-CM | POA: Diagnosis not present

## 2017-03-29 DIAGNOSIS — R700 Elevated erythrocyte sedimentation rate: Secondary | ICD-10-CM | POA: Diagnosis not present

## 2017-03-29 DIAGNOSIS — M353 Polymyalgia rheumatica: Secondary | ICD-10-CM | POA: Diagnosis not present

## 2017-03-29 DIAGNOSIS — M170 Bilateral primary osteoarthritis of knee: Secondary | ICD-10-CM | POA: Diagnosis not present

## 2017-04-09 ENCOUNTER — Ambulatory Visit (INDEPENDENT_AMBULATORY_CARE_PROVIDER_SITE_OTHER): Payer: Medicare Other | Admitting: Orthopedic Surgery

## 2017-04-09 ENCOUNTER — Ambulatory Visit (INDEPENDENT_AMBULATORY_CARE_PROVIDER_SITE_OTHER): Payer: Self-pay

## 2017-04-09 ENCOUNTER — Encounter (INDEPENDENT_AMBULATORY_CARE_PROVIDER_SITE_OTHER): Payer: Self-pay | Admitting: Orthopedic Surgery

## 2017-04-09 DIAGNOSIS — G8929 Other chronic pain: Secondary | ICD-10-CM | POA: Diagnosis not present

## 2017-04-09 DIAGNOSIS — M1711 Unilateral primary osteoarthritis, right knee: Secondary | ICD-10-CM | POA: Diagnosis not present

## 2017-04-09 DIAGNOSIS — M25562 Pain in left knee: Secondary | ICD-10-CM | POA: Diagnosis not present

## 2017-04-09 DIAGNOSIS — M1712 Unilateral primary osteoarthritis, left knee: Secondary | ICD-10-CM

## 2017-04-09 DIAGNOSIS — M25561 Pain in right knee: Secondary | ICD-10-CM | POA: Diagnosis not present

## 2017-04-09 NOTE — Progress Notes (Signed)
Office Visit Note   Patient: Gary [name]           Date of Birth: April 11, 1950           MRN: [id number] Visit Date: 04/09/2017 Requested by: [name], [name], MD [address] [place], [place] [id number] PCP: [name], [name], MD  Subjective: Chief Complaint  Patient presents with  . Left Knee - Pain  . Right Knee - Pain    HPI: Nadine CountsBob is a patient with bilateral knee pain.  He would like to discuss his options.  He has had chronic knee pain but is been worse over the last 4 months.  States he feels unstable when he walks.  Reports locking and catching in both knees.  BMI 56.  He is using a cane.  Knees been hurting for 15 years.  He does have a history of having cortisone shots which helped him some as well as gel injections last ones 5 years ago which helped some on the first round of shots but not on the second round of shots.  He likes to fish but needs to have cortisone shots done in his knee before fishing tournaments in the late spring and early summer              ROS: All systems reviewed are negative as they relate to the chief complaint within the history of present illness.  Patient denies  fevers or chills.   Assessment & Plan: Visit Diagnoses:  1. Chronic pain of both knees     Plan: Impression is end-stage bilateral knee arthritis.  I do not think the patient is a great candidate for knee replacement due to the high complication rate associated with his body mass index.  This could potentially lead to infection and amputation.  He has a very definite schedule of knee injections which helped him to be able to participate in fishing tournaments.  I will see him back in May for further evaluation and potential injections at that time.  Follow-Up Instructions: Return if symptoms worsen or fail to improve.   Orders:  Orders Placed This Encounter  Procedures  . XR Knee 1-2 Views Left  . XR Knee 1-2 Views Right   No orders of the defined types were placed in this  encounter.     Procedures: No procedures performed   Clinical Data: No additional findings.  Objective: Vital Signs: There were no vitals taken for this visit.  Physical Exam:   Constitutional: Patient appears well-developed HEENT:  Head: Normocephalic Eyes:EOM are normal Neck: Normal range of motion Cardiovascular: Normal rate Pulmonary/chest: Effort normal Neurologic: Patient is alert Skin: Skin is warm Psychiatric: Patient has normal mood and affect    Ortho Exam: Orthopedic exam demonstrates ambulation with a cane.  Pedal pulses palpable.  He does have some edema in bilateral lower extremities but no evidence of significant venous stasis.  Both knees actually have flexion past 90 and full extension.  Collaterals are stable.  I do not detect much of an effusion in either knee.  No groin pain with internal/external rotation of the leg.  Specialty Comments:  No specialty comments available.  Imaging: No results found.   PMFS History: Patient Active Problem List   Diagnosis Date Noted  . Abdominal wall cellulitis 09/10/2015  . Incisional hernia, without obstruction or gangrene 09/09/2015  . CKD (chronic kidney disease) stage 3, GFR 30-59 ml/min (HCC) 09/09/2015  . Symptomatic cholelithiasis 08/18/2015  . Left facial pain 11/26/2014  .  Numbness of right anterior thigh 11/26/2014  . Bilateral leg numbness 11/26/2014  . Spinal stenosis of lumbar region 11/26/2014  . Precordial pain   . CAD (coronary artery disease), native coronary artery 09/07/2014  . Sleep apnea 09/07/2014  . GERD (gastroesophageal reflux disease) 09/07/2014  . Dyspnea 09/07/2014  . Hypertensive heart disease without CHF   . Diastolic dysfunction   . Morbid obesity (HCC)   . Bariatric surgery status    Past Medical History:  Diagnosis Date  . Anxiety   . CAD (coronary artery disease), native coronary artery 09/07/2014   3 vessel coronary calcification noted on CT scan 2016   . Depression    . Diastolic dysfunction   . Dysrhythmia    hX A-Fib  . GERD (gastroesophageal reflux disease)   . History of hiatal hernia   . Hypertension   . Hypertensive heart disease without CHF   . Morbid obesity (HCC)   . Sleep apnea    does not use or have a cpap-he is supposed to use one    Family History  Problem Relation Age of Onset  . Heart attack Father   . Diabetes Father   . Heart failure Mother   . Pancreatic cancer Mother   . Multiple sclerosis Sister   . Diabetes Maternal Grandmother   . Heart attack Maternal Grandfather   . Diabetes Paternal Grandmother   . Heart attack Paternal Grandfather   . Hypertension Brother   . Cancer - Colon Brother 48  . Hypertension Maternal Aunt     Past Surgical History:  Procedure Laterality Date  . CARDIAC CATHETERIZATION N/A 09/23/2014   Procedure: Left Heart Cath and Coronary Angiography;  Surgeon: Kathleene Hazel, MD;  [facility] INVASIVE CV LAB;  Service: Cardiovascular;  Laterality: N/A;  . CHOLECYSTECTOMY N/A 08/18/2015   Procedure: LAPAROSCOPIC CHOLECYSTECTOMY;  Surgeon: Avel Peace, MD;  Location: [facility] ORS;  Service: General;  Laterality: N/A;  . COLONOSCOPY    . GASTRIC BYPASS  1982  . MOUTH SURGERY    . UPPER GI ENDOSCOPY     Social History   Occupational History  . Occupation: retired    Comment: Biomedical engineer  Tobacco Use  . Smoking status: Former Smoker    Types: Cigars    Last attempt to quit: 01/16/2006    Years since quitting: 11.2  . Smokeless tobacco: Never Used  Substance and Sexual Activity  . Alcohol use: Yes    Alcohol/week: 0.0 oz    Comment: occ  . Drug use: No  . Sexual activity: Not on file

## 2017-04-11 DIAGNOSIS — M170 Bilateral primary osteoarthritis of knee: Secondary | ICD-10-CM | POA: Diagnosis not present

## 2017-04-11 DIAGNOSIS — M150 Primary generalized (osteo)arthritis: Secondary | ICD-10-CM | POA: Diagnosis not present

## 2017-04-11 DIAGNOSIS — R700 Elevated erythrocyte sedimentation rate: Secondary | ICD-10-CM | POA: Diagnosis not present

## 2017-04-11 DIAGNOSIS — M353 Polymyalgia rheumatica: Secondary | ICD-10-CM | POA: Diagnosis not present

## 2017-04-27 DIAGNOSIS — I890 Lymphedema, not elsewhere classified: Secondary | ICD-10-CM | POA: Diagnosis not present

## 2017-05-01 DIAGNOSIS — I890 Lymphedema, not elsewhere classified: Secondary | ICD-10-CM | POA: Diagnosis not present

## 2017-05-04 DIAGNOSIS — I890 Lymphedema, not elsewhere classified: Secondary | ICD-10-CM | POA: Diagnosis not present

## 2017-05-08 DIAGNOSIS — I890 Lymphedema, not elsewhere classified: Secondary | ICD-10-CM | POA: Diagnosis not present

## 2017-05-10 DIAGNOSIS — I890 Lymphedema, not elsewhere classified: Secondary | ICD-10-CM | POA: Diagnosis not present

## 2017-05-15 DIAGNOSIS — I2510 Atherosclerotic heart disease of native coronary artery without angina pectoris: Secondary | ICD-10-CM | POA: Diagnosis not present

## 2017-05-15 DIAGNOSIS — I119 Hypertensive heart disease without heart failure: Secondary | ICD-10-CM | POA: Diagnosis not present

## 2017-05-15 DIAGNOSIS — I5032 Chronic diastolic (congestive) heart failure: Secondary | ICD-10-CM | POA: Diagnosis not present

## 2017-05-15 DIAGNOSIS — I480 Paroxysmal atrial fibrillation: Secondary | ICD-10-CM | POA: Diagnosis not present

## 2017-05-15 DIAGNOSIS — I890 Lymphedema, not elsewhere classified: Secondary | ICD-10-CM | POA: Diagnosis not present

## 2017-05-17 DIAGNOSIS — I890 Lymphedema, not elsewhere classified: Secondary | ICD-10-CM | POA: Diagnosis not present

## 2017-05-17 DIAGNOSIS — R2689 Other abnormalities of gait and mobility: Secondary | ICD-10-CM | POA: Diagnosis not present

## 2017-05-22 DIAGNOSIS — I890 Lymphedema, not elsewhere classified: Secondary | ICD-10-CM | POA: Diagnosis not present

## 2017-05-24 DIAGNOSIS — I890 Lymphedema, not elsewhere classified: Secondary | ICD-10-CM | POA: Diagnosis not present

## 2017-05-29 DIAGNOSIS — I890 Lymphedema, not elsewhere classified: Secondary | ICD-10-CM | POA: Diagnosis not present

## 2017-05-31 DIAGNOSIS — I890 Lymphedema, not elsewhere classified: Secondary | ICD-10-CM | POA: Diagnosis not present

## 2017-06-04 DIAGNOSIS — R2689 Other abnormalities of gait and mobility: Secondary | ICD-10-CM | POA: Diagnosis not present

## 2017-06-04 DIAGNOSIS — I890 Lymphedema, not elsewhere classified: Secondary | ICD-10-CM | POA: Diagnosis not present

## 2017-06-07 DIAGNOSIS — I890 Lymphedema, not elsewhere classified: Secondary | ICD-10-CM | POA: Diagnosis not present

## 2017-06-12 DIAGNOSIS — I890 Lymphedema, not elsewhere classified: Secondary | ICD-10-CM | POA: Diagnosis not present

## 2017-06-12 DIAGNOSIS — R2689 Other abnormalities of gait and mobility: Secondary | ICD-10-CM | POA: Diagnosis not present

## 2017-06-13 DIAGNOSIS — R700 Elevated erythrocyte sedimentation rate: Secondary | ICD-10-CM | POA: Diagnosis not present

## 2017-06-13 DIAGNOSIS — M170 Bilateral primary osteoarthritis of knee: Secondary | ICD-10-CM | POA: Diagnosis not present

## 2017-06-13 DIAGNOSIS — M353 Polymyalgia rheumatica: Secondary | ICD-10-CM | POA: Diagnosis not present

## 2017-06-13 DIAGNOSIS — M150 Primary generalized (osteo)arthritis: Secondary | ICD-10-CM | POA: Diagnosis not present

## 2017-06-14 DIAGNOSIS — R2689 Other abnormalities of gait and mobility: Secondary | ICD-10-CM | POA: Diagnosis not present

## 2017-06-14 DIAGNOSIS — I890 Lymphedema, not elsewhere classified: Secondary | ICD-10-CM | POA: Diagnosis not present

## 2017-06-19 ENCOUNTER — Other Ambulatory Visit: Payer: Self-pay | Admitting: Rheumatology

## 2017-06-19 DIAGNOSIS — M159 Polyosteoarthritis, unspecified: Secondary | ICD-10-CM

## 2017-06-19 DIAGNOSIS — M8949 Other hypertrophic osteoarthropathy, multiple sites: Secondary | ICD-10-CM

## 2017-06-19 DIAGNOSIS — Z79899 Other long term (current) drug therapy: Secondary | ICD-10-CM

## 2017-06-19 DIAGNOSIS — M150 Primary generalized (osteo)arthritis: Principal | ICD-10-CM

## 2017-06-20 ENCOUNTER — Ambulatory Visit (INDEPENDENT_AMBULATORY_CARE_PROVIDER_SITE_OTHER): Payer: Medicare Other | Admitting: Orthopedic Surgery

## 2017-06-20 ENCOUNTER — Encounter (INDEPENDENT_AMBULATORY_CARE_PROVIDER_SITE_OTHER): Payer: Self-pay | Admitting: Orthopedic Surgery

## 2017-06-20 DIAGNOSIS — M1712 Unilateral primary osteoarthritis, left knee: Secondary | ICD-10-CM

## 2017-06-20 DIAGNOSIS — M1711 Unilateral primary osteoarthritis, right knee: Secondary | ICD-10-CM

## 2017-06-24 ENCOUNTER — Encounter (INDEPENDENT_AMBULATORY_CARE_PROVIDER_SITE_OTHER): Payer: Self-pay | Admitting: Orthopedic Surgery

## 2017-06-24 DIAGNOSIS — M1711 Unilateral primary osteoarthritis, right knee: Secondary | ICD-10-CM

## 2017-06-24 DIAGNOSIS — M1712 Unilateral primary osteoarthritis, left knee: Secondary | ICD-10-CM | POA: Diagnosis not present

## 2017-06-24 MED ORDER — BUPIVACAINE HCL 0.25 % IJ SOLN
4.0000 mL | INTRAMUSCULAR | Status: AC | PRN
  Administered 2017-06-24: 4 mL via INTRA_ARTICULAR

## 2017-06-24 MED ORDER — LIDOCAINE HCL 1 % IJ SOLN
5.0000 mL | INTRAMUSCULAR | Status: AC | PRN
  Administered 2017-06-24: 5 mL

## 2017-06-24 MED ORDER — METHYLPREDNISOLONE ACETATE 40 MG/ML IJ SUSP
40.0000 mg | INTRAMUSCULAR | Status: AC | PRN
  Administered 2017-06-24: 40 mg via INTRA_ARTICULAR

## 2017-06-24 NOTE — Progress Notes (Signed)
   Procedure Note  Patient: Gary [name]             Date of Birth: 16-May-1950           MRN: [id number]             Visit Date: 06/20/2017  Procedures: Visit Diagnoses: Unilateral primary osteoarthritis, left knee  Unilateral primary osteoarthritis, right knee  Large Joint Inj: bilateral knee on 06/24/2017 11:33 PM Indications: diagnostic evaluation, joint swelling and pain Details: 18 G 1.5 in needle, superolateral approach  Arthrogram: No  Medications (Right): 5 mL lidocaine 1 %; 4 mL bupivacaine 0.25 %; 40 mg methylPREDNISolone acetate 40 MG/ML Medications (Left): 5 mL lidocaine 1 %; 4 mL bupivacaine 0.25 %; 40 mg methylPREDNISolone acetate 40 MG/ML Outcome: tolerated well, no immediate complications Procedure, treatment alternatives, risks and benefits explained, specific risks discussed. Consent was given by the patient. Immediately prior to procedure a time out was called to verify the correct patient, procedure, equipment, support staff and site/side marked as required. Patient was prepped and draped in the usual sterile fashion.

## 2017-07-04 DIAGNOSIS — L0390 Cellulitis, unspecified: Secondary | ICD-10-CM | POA: Diagnosis not present

## 2017-08-02 ENCOUNTER — Ambulatory Visit
Admission: RE | Admit: 2017-08-02 | Discharge: 2017-08-02 | Disposition: A | Discharge status: Home / Self Care | Payer: Medicare Other | Source: Ambulatory Visit | Attending: Rheumatology | Admitting: Rheumatology

## 2017-08-02 DIAGNOSIS — Z79899 Other long term (current) drug therapy: Secondary | ICD-10-CM

## 2017-08-02 DIAGNOSIS — M810 Age-related osteoporosis without current pathological fracture: Secondary | ICD-10-CM | POA: Diagnosis not present

## 2017-08-14 DIAGNOSIS — M353 Polymyalgia rheumatica: Secondary | ICD-10-CM | POA: Diagnosis not present

## 2017-08-14 DIAGNOSIS — M810 Age-related osteoporosis without current pathological fracture: Secondary | ICD-10-CM | POA: Diagnosis not present

## 2017-08-14 DIAGNOSIS — Z7952 Long term (current) use of systemic steroids: Secondary | ICD-10-CM | POA: Diagnosis not present

## 2017-08-14 DIAGNOSIS — R700 Elevated erythrocyte sedimentation rate: Secondary | ICD-10-CM | POA: Diagnosis not present

## 2017-08-14 DIAGNOSIS — M1990 Unspecified osteoarthritis, unspecified site: Secondary | ICD-10-CM | POA: Diagnosis not present

## 2017-09-10 DIAGNOSIS — I890 Lymphedema, not elsewhere classified: Secondary | ICD-10-CM | POA: Diagnosis not present

## 2017-09-24 DIAGNOSIS — M791 Myalgia, unspecified site: Secondary | ICD-10-CM | POA: Diagnosis not present

## 2017-09-24 DIAGNOSIS — N390 Urinary tract infection, site not specified: Secondary | ICD-10-CM | POA: Diagnosis not present

## 2017-09-24 DIAGNOSIS — R5382 Chronic fatigue, unspecified: Secondary | ICD-10-CM | POA: Diagnosis not present

## 2017-09-24 DIAGNOSIS — R51 Headache: Secondary | ICD-10-CM | POA: Diagnosis not present

## 2017-09-24 DIAGNOSIS — R609 Edema, unspecified: Secondary | ICD-10-CM | POA: Diagnosis not present

## 2017-09-24 DIAGNOSIS — R350 Frequency of micturition: Secondary | ICD-10-CM | POA: Diagnosis not present

## 2017-10-11 DIAGNOSIS — M353 Polymyalgia rheumatica: Secondary | ICD-10-CM | POA: Diagnosis not present

## 2017-11-14 DIAGNOSIS — M1990 Unspecified osteoarthritis, unspecified site: Secondary | ICD-10-CM | POA: Diagnosis not present

## 2017-11-14 DIAGNOSIS — Z79899 Other long term (current) drug therapy: Secondary | ICD-10-CM | POA: Diagnosis not present

## 2017-11-14 DIAGNOSIS — M810 Age-related osteoporosis without current pathological fracture: Secondary | ICD-10-CM | POA: Diagnosis not present

## 2017-11-14 DIAGNOSIS — Z7952 Long term (current) use of systemic steroids: Secondary | ICD-10-CM | POA: Diagnosis not present

## 2017-11-14 DIAGNOSIS — M353 Polymyalgia rheumatica: Secondary | ICD-10-CM | POA: Diagnosis not present

## 2017-11-14 DIAGNOSIS — R0989 Other specified symptoms and signs involving the circulatory and respiratory systems: Secondary | ICD-10-CM | POA: Diagnosis not present

## 2018-03-06 DIAGNOSIS — I10 Essential (primary) hypertension: Secondary | ICD-10-CM | POA: Diagnosis not present

## 2018-03-06 DIAGNOSIS — Z1159 Encounter for screening for other viral diseases: Secondary | ICD-10-CM | POA: Diagnosis not present

## 2018-03-06 DIAGNOSIS — Z125 Encounter for screening for malignant neoplasm of prostate: Secondary | ICD-10-CM | POA: Diagnosis not present

## 2018-03-13 DIAGNOSIS — Z0000 Encounter for general adult medical examination without abnormal findings: Secondary | ICD-10-CM | POA: Diagnosis not present

## 2018-03-13 DIAGNOSIS — F339 Major depressive disorder, recurrent, unspecified: Secondary | ICD-10-CM | POA: Diagnosis not present

## 2018-03-13 DIAGNOSIS — M159 Polyosteoarthritis, unspecified: Secondary | ICD-10-CM | POA: Diagnosis not present

## 2018-03-13 DIAGNOSIS — R0609 Other forms of dyspnea: Secondary | ICD-10-CM | POA: Diagnosis not present

## 2018-03-25 DIAGNOSIS — R030 Elevated blood-pressure reading, without diagnosis of hypertension: Secondary | ICD-10-CM | POA: Diagnosis not present

## 2018-06-12 DIAGNOSIS — M170 Bilateral primary osteoarthritis of knee: Secondary | ICD-10-CM | POA: Diagnosis not present

## 2018-07-16 ENCOUNTER — Encounter

## 2018-07-25 DIAGNOSIS — M79652 Pain in left thigh: Secondary | ICD-10-CM | POA: Diagnosis not present

## 2018-07-25 DIAGNOSIS — I10 Essential (primary) hypertension: Secondary | ICD-10-CM | POA: Diagnosis not present

## 2018-07-25 DIAGNOSIS — I890 Lymphedema, not elsewhere classified: Secondary | ICD-10-CM | POA: Diagnosis not present

## 2018-07-25 DIAGNOSIS — M79651 Pain in right thigh: Secondary | ICD-10-CM | POA: Diagnosis not present

## 2018-08-06 ENCOUNTER — Ambulatory Visit: Payer: Medicare Other | Admitting: Cardiovascular Disease

## 2018-08-19 DIAGNOSIS — M47817 Spondylosis without myelopathy or radiculopathy, lumbosacral region: Secondary | ICD-10-CM | POA: Diagnosis not present

## 2018-08-19 DIAGNOSIS — M545 Low back pain: Secondary | ICD-10-CM | POA: Diagnosis not present

## 2018-09-09 ENCOUNTER — Other Ambulatory Visit: Payer: Self-pay | Admitting: Sports Medicine

## 2018-09-09 DIAGNOSIS — M545 Low back pain, unspecified: Secondary | ICD-10-CM

## 2018-09-26 ENCOUNTER — Ambulatory Visit: Payer: Medicare Other | Admitting: Cardiovascular Disease

## 2018-10-05 ENCOUNTER — Ambulatory Visit
Admission: RE | Admit: 2018-10-05 | Discharge: 2018-10-05 | Disposition: A | Discharge status: Home / Self Care | Payer: Medicare Other | Source: Ambulatory Visit | Attending: Sports Medicine | Admitting: Sports Medicine

## 2018-10-05 ENCOUNTER — Other Ambulatory Visit: Payer: Self-pay

## 2018-10-05 DIAGNOSIS — M545 Low back pain, unspecified: Secondary | ICD-10-CM

## 2018-10-09 DIAGNOSIS — M545 Low back pain: Secondary | ICD-10-CM | POA: Diagnosis not present

## 2018-10-09 DIAGNOSIS — M47817 Spondylosis without myelopathy or radiculopathy, lumbosacral region: Secondary | ICD-10-CM | POA: Diagnosis not present

## 2018-10-17 ENCOUNTER — Other Ambulatory Visit (HOSPITAL_COMMUNITY): Payer: Self-pay | Admitting: Chiropractic Medicine

## 2018-10-17 ENCOUNTER — Telehealth (HOSPITAL_COMMUNITY): Payer: Self-pay

## 2018-10-17 DIAGNOSIS — M5416 Radiculopathy, lumbar region: Secondary | ICD-10-CM

## 2018-10-17 NOTE — Telephone Encounter (Signed)
Called to schedule lumbar injection, no answer, left vm. AW 

## 2018-10-23 ENCOUNTER — Encounter (HOSPITAL_COMMUNITY): Payer: Self-pay | Admitting: Interventional Radiology

## 2018-10-23 ENCOUNTER — Ambulatory Visit (HOSPITAL_COMMUNITY)
Admission: RE | Admit: 2018-10-23 | Discharge: 2018-10-23 | Disposition: A | Discharge status: Home / Self Care | Payer: Medicare Other | Source: Ambulatory Visit | Attending: Chiropractic Medicine | Admitting: Chiropractic Medicine

## 2018-10-23 ENCOUNTER — Other Ambulatory Visit: Payer: Self-pay

## 2018-10-23 DIAGNOSIS — M170 Bilateral primary osteoarthritis of knee: Secondary | ICD-10-CM | POA: Insufficient documentation

## 2018-10-23 DIAGNOSIS — K219 Gastro-esophageal reflux disease without esophagitis: Secondary | ICD-10-CM | POA: Diagnosis not present

## 2018-10-23 DIAGNOSIS — M5416 Radiculopathy, lumbar region: Secondary | ICD-10-CM

## 2018-10-23 DIAGNOSIS — Z79899 Other long term (current) drug therapy: Secondary | ICD-10-CM | POA: Diagnosis not present

## 2018-10-23 DIAGNOSIS — I110 Hypertensive heart disease with heart failure: Secondary | ICD-10-CM | POA: Diagnosis not present

## 2018-10-23 DIAGNOSIS — M48061 Spinal stenosis, lumbar region without neurogenic claudication: Secondary | ICD-10-CM | POA: Diagnosis not present

## 2018-10-23 DIAGNOSIS — Z6841 Body Mass Index (BMI) 40.0 and over, adult: Secondary | ICD-10-CM | POA: Diagnosis not present

## 2018-10-23 DIAGNOSIS — I509 Heart failure, unspecified: Secondary | ICD-10-CM | POA: Diagnosis not present

## 2018-10-23 DIAGNOSIS — G4730 Sleep apnea, unspecified: Secondary | ICD-10-CM | POA: Diagnosis not present

## 2018-10-23 DIAGNOSIS — F329 Major depressive disorder, single episode, unspecified: Secondary | ICD-10-CM | POA: Diagnosis not present

## 2018-10-23 DIAGNOSIS — M47817 Spondylosis without myelopathy or radiculopathy, lumbosacral region: Secondary | ICD-10-CM | POA: Diagnosis not present

## 2018-10-23 HISTORY — PX: IR INJECT/THERA/INC NEEDLE/CATH/PLC EPI/LUMB/SAC W/IMG: IMG6130

## 2018-10-23 MED ORDER — LIDOCAINE HCL (PF) 1 % IJ SOLN
INTRAMUSCULAR | Status: DC | PRN
  Administered 2018-10-23: 20 mL

## 2018-10-23 MED ORDER — LIDOCAINE HCL (PF) 1 % IJ SOLN
INTRAMUSCULAR | Status: AC
  Filled 2018-10-23: qty 30

## 2018-10-23 MED ORDER — SODIUM CHLORIDE (PF) 0.9 % IJ SOLN
INTRAMUSCULAR | Status: AC
  Administered 2018-10-23: 2 mL
  Filled 2018-10-23: qty 10

## 2018-10-23 MED ORDER — METHYLPREDNISOLONE ACETATE 40 MG/ML IJ SUSP
INTRAMUSCULAR | Status: AC
  Administered 2018-10-23: 40 mg
  Filled 2018-10-23: qty 1

## 2018-10-23 MED ORDER — METHYLPREDNISOLONE ACETATE 80 MG/ML IJ SUSP
INTRAMUSCULAR | Status: AC
  Administered 2018-10-23: 13:00:00 80 mg
  Filled 2018-10-23: qty 1

## 2018-10-23 MED ORDER — IOPAMIDOL (ISOVUE-M 200) INJECTION 41%
INTRAMUSCULAR | Status: AC
  Administered 2018-10-23: 2 mL
  Filled 2018-10-23: qty 10

## 2018-10-23 NOTE — Procedures (Signed)
  Procedure: L and R L4 S-NRB 120mg  depomedrol + 57ml Lido1% EBL:   minimal Complications:  none immediate  See full dictation in [employer].  Dillard Cannon MD Main # [phone] Pager  [phone]

## 2018-10-24 ENCOUNTER — Encounter (HOSPITAL_COMMUNITY): Payer: Self-pay | Admitting: Radiology

## 2018-10-24 ENCOUNTER — Other Ambulatory Visit (HOSPITAL_COMMUNITY): Payer: Self-pay | Admitting: Chiropractic Medicine

## 2018-10-24 DIAGNOSIS — M5416 Radiculopathy, lumbar region: Secondary | ICD-10-CM

## 2018-10-29 DIAGNOSIS — M353 Polymyalgia rheumatica: Secondary | ICD-10-CM | POA: Diagnosis not present

## 2018-10-29 DIAGNOSIS — M810 Age-related osteoporosis without current pathological fracture: Secondary | ICD-10-CM | POA: Diagnosis not present

## 2018-10-29 DIAGNOSIS — Z79899 Other long term (current) drug therapy: Secondary | ICD-10-CM | POA: Diagnosis not present

## 2018-10-29 DIAGNOSIS — M1990 Unspecified osteoarthritis, unspecified site: Secondary | ICD-10-CM | POA: Diagnosis not present

## 2018-11-20 ENCOUNTER — Encounter (HOSPITAL_COMMUNITY): Payer: Self-pay | Admitting: Interventional Radiology

## 2019-01-29 DIAGNOSIS — M353 Polymyalgia rheumatica: Secondary | ICD-10-CM | POA: Diagnosis not present

## 2019-01-29 DIAGNOSIS — M170 Bilateral primary osteoarthritis of knee: Secondary | ICD-10-CM | POA: Diagnosis not present

## 2019-01-29 DIAGNOSIS — Z7952 Long term (current) use of systemic steroids: Secondary | ICD-10-CM | POA: Diagnosis not present

## 2019-02-07 DIAGNOSIS — B351 Tinea unguium: Secondary | ICD-10-CM | POA: Diagnosis not present

## 2019-02-07 DIAGNOSIS — I739 Peripheral vascular disease, unspecified: Secondary | ICD-10-CM | POA: Diagnosis not present

## 2019-02-07 DIAGNOSIS — L84 Corns and callosities: Secondary | ICD-10-CM | POA: Diagnosis not present

## 2019-02-07 DIAGNOSIS — M2022 Hallux rigidus, left foot: Secondary | ICD-10-CM | POA: Diagnosis not present

## 2019-03-17 DIAGNOSIS — R2681 Unsteadiness on feet: Secondary | ICD-10-CM | POA: Diagnosis not present

## 2019-03-17 DIAGNOSIS — R262 Difficulty in walking, not elsewhere classified: Secondary | ICD-10-CM | POA: Diagnosis not present

## 2019-03-17 DIAGNOSIS — R2689 Other abnormalities of gait and mobility: Secondary | ICD-10-CM | POA: Diagnosis not present

## 2019-03-17 DIAGNOSIS — M6281 Muscle weakness (generalized): Secondary | ICD-10-CM | POA: Diagnosis not present

## 2019-06-03 DIAGNOSIS — M79605 Pain in left leg: Secondary | ICD-10-CM | POA: Diagnosis not present

## 2019-06-03 DIAGNOSIS — M79604 Pain in right leg: Secondary | ICD-10-CM | POA: Diagnosis not present

## 2019-06-03 DIAGNOSIS — R5381 Other malaise: Secondary | ICD-10-CM | POA: Diagnosis not present

## 2019-06-03 DIAGNOSIS — R29898 Other symptoms and signs involving the musculoskeletal system: Secondary | ICD-10-CM | POA: Diagnosis not present

## 2019-06-11 DIAGNOSIS — Z6841 Body Mass Index (BMI) 40.0 and over, adult: Secondary | ICD-10-CM | POA: Diagnosis not present

## 2019-06-11 DIAGNOSIS — M353 Polymyalgia rheumatica: Secondary | ICD-10-CM | POA: Diagnosis not present

## 2019-06-11 DIAGNOSIS — M170 Bilateral primary osteoarthritis of knee: Secondary | ICD-10-CM | POA: Diagnosis not present

## 2019-06-11 DIAGNOSIS — G2581 Restless legs syndrome: Secondary | ICD-10-CM | POA: Diagnosis not present

## 2019-06-18 DIAGNOSIS — G2581 Restless legs syndrome: Secondary | ICD-10-CM | POA: Diagnosis not present

## 2019-06-18 DIAGNOSIS — M353 Polymyalgia rheumatica: Secondary | ICD-10-CM | POA: Diagnosis not present

## 2019-06-18 DIAGNOSIS — M170 Bilateral primary osteoarthritis of knee: Secondary | ICD-10-CM | POA: Diagnosis not present

## 2019-07-11 DIAGNOSIS — Z6841 Body Mass Index (BMI) 40.0 and over, adult: Secondary | ICD-10-CM | POA: Diagnosis not present

## 2019-07-11 DIAGNOSIS — G2581 Restless legs syndrome: Secondary | ICD-10-CM | POA: Diagnosis not present

## 2019-07-11 DIAGNOSIS — M170 Bilateral primary osteoarthritis of knee: Secondary | ICD-10-CM | POA: Diagnosis not present

## 2019-07-11 DIAGNOSIS — M353 Polymyalgia rheumatica: Secondary | ICD-10-CM | POA: Diagnosis not present

## 2019-09-27 IMAGING — MR MR LUMBAR SPINE W/O CM
4 of 5 series · 18 of 48 positions shown · non-contrast
Comparison: 11/17/2014

CLINICAL DATA: Progressive low back pain.  Bilateral thigh pain.

EXAM:
MRI LUMBAR SPINE WITHOUT CONTRAST
TECHNIQUE: Multiplanar, multisequence MR imaging of the lumbar spine was
performed. No intravenous contrast was administered.

[Series 6: T2 · sagittal · 4.0mm · 0.73mm/px · 6 of 15 slices shown (1 of 2)]
[im 1/15]
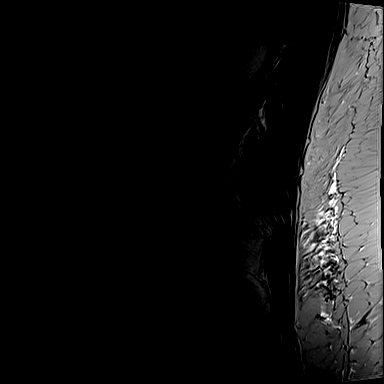
[im 3/15]
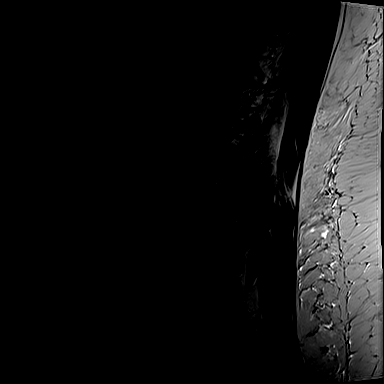
[im 6/15]
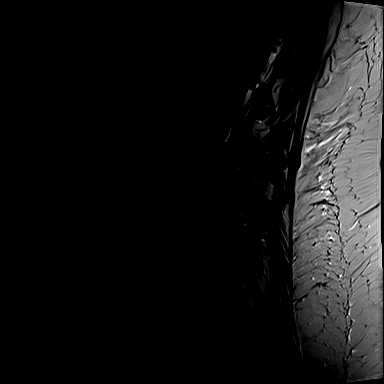
[im 9/15]
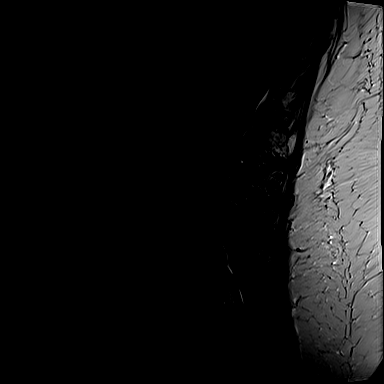
[im 12/15]
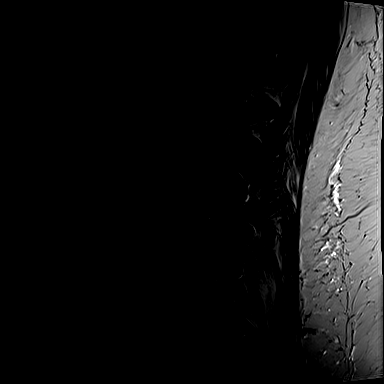
[im 15/15]
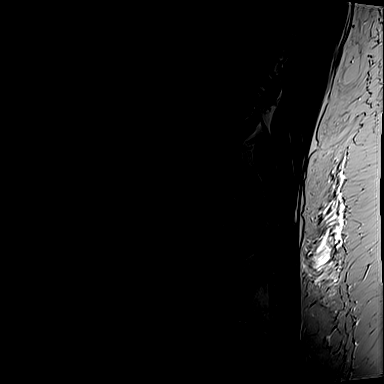

[Series 7: T1 · sagittal · 4.0mm · 0.73mm/px · 3 of 15 slices shown (1 of 2)]
[im 3/15]
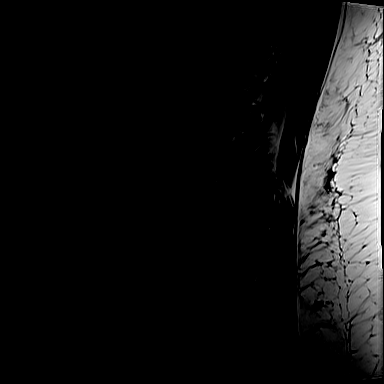
[im 9/15]
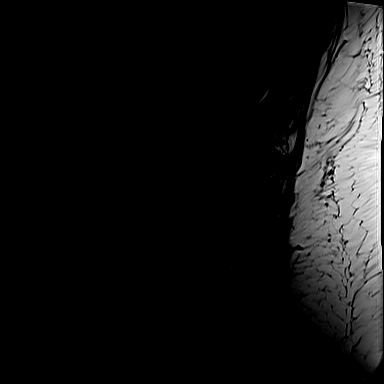
[im 15/15]
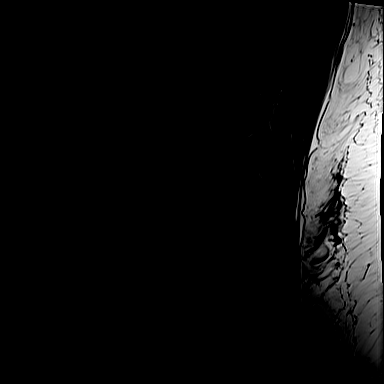

[Series 11: T1 · axial · 4.0mm · 0.28mm/px · z∈[-17,+149]mm · 3 of 37 slices shown (2 of 2)]
[im 6/37]
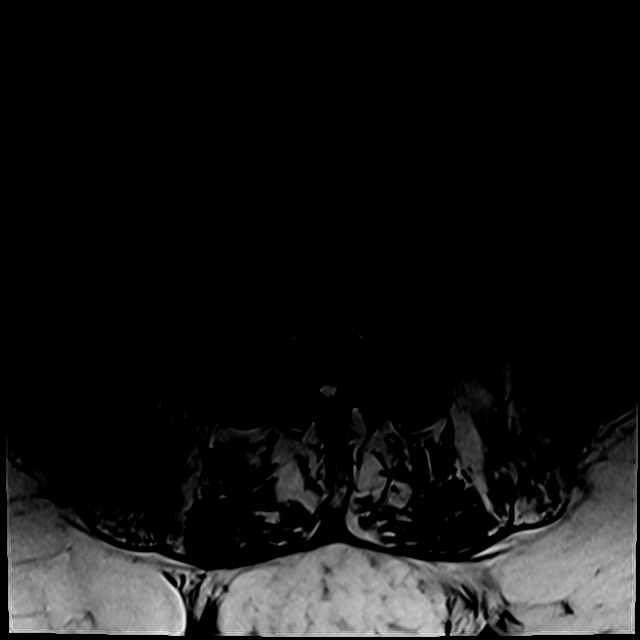
[im 19/37]
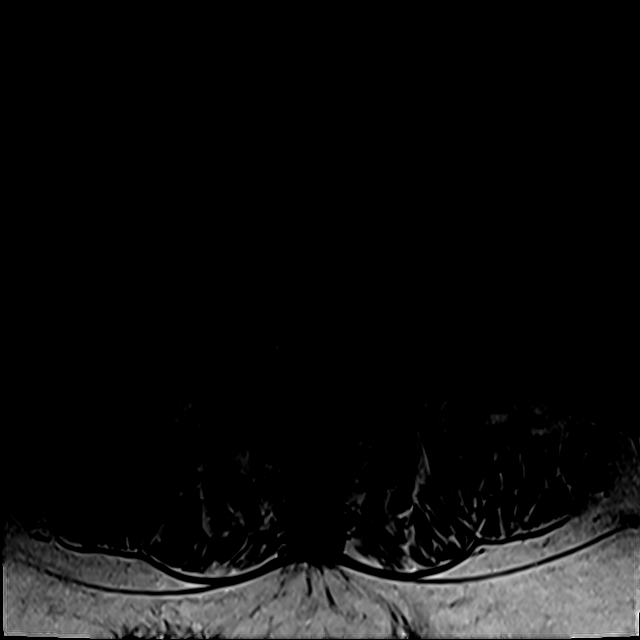
[im 31/37]
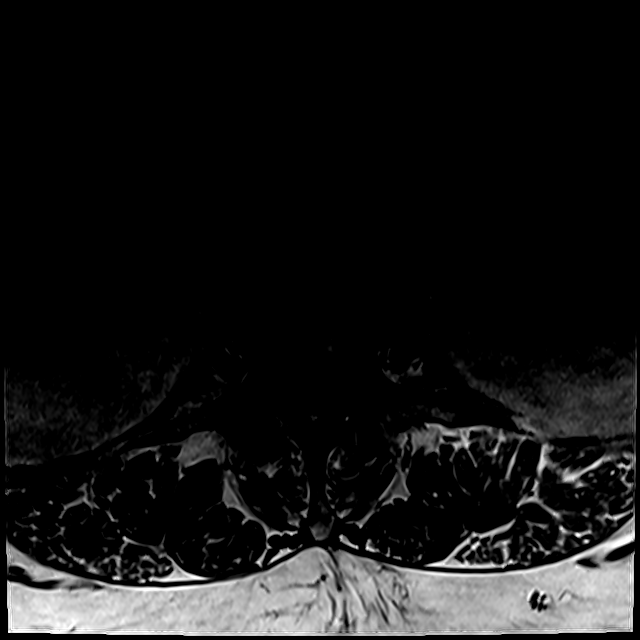

[Series 14: T2 · axial · 4.0mm · 0.28mm/px · z∈[-42,+149]mm · 6 of 37 slices shown (2 of 2)]
[im 1/37]
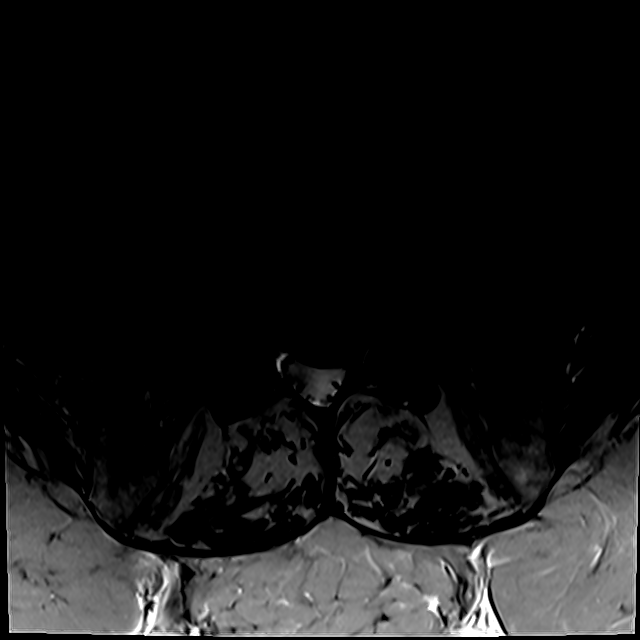
[im 6/37]
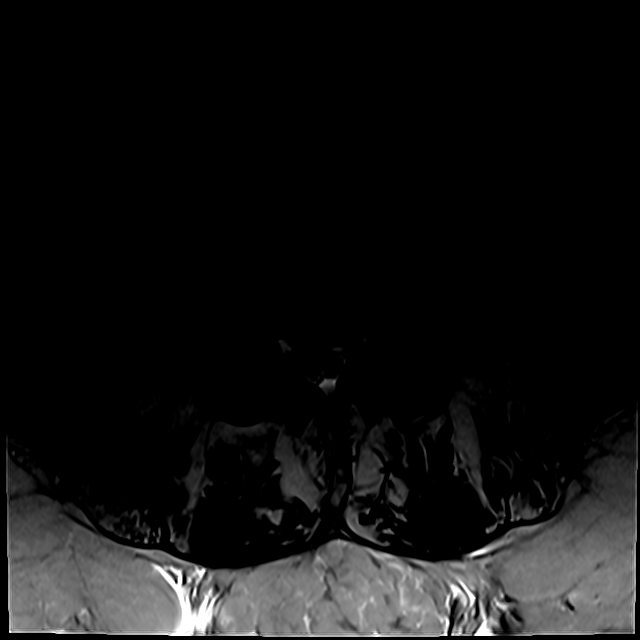
[im 11/37]
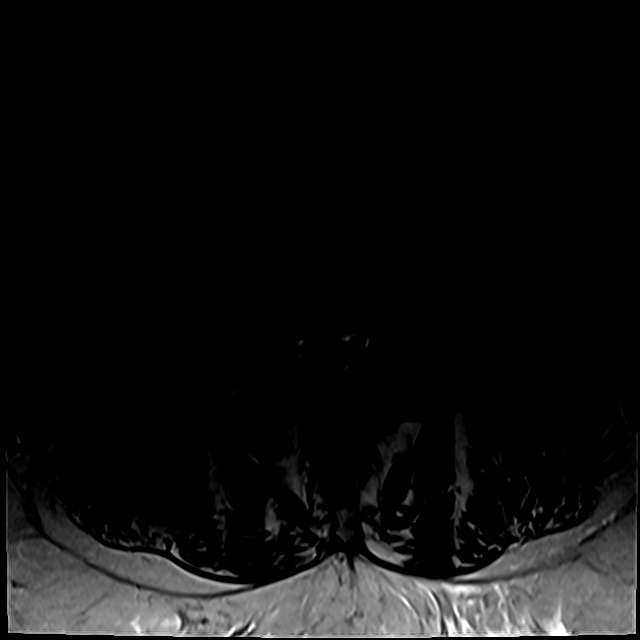
[im 16/37]
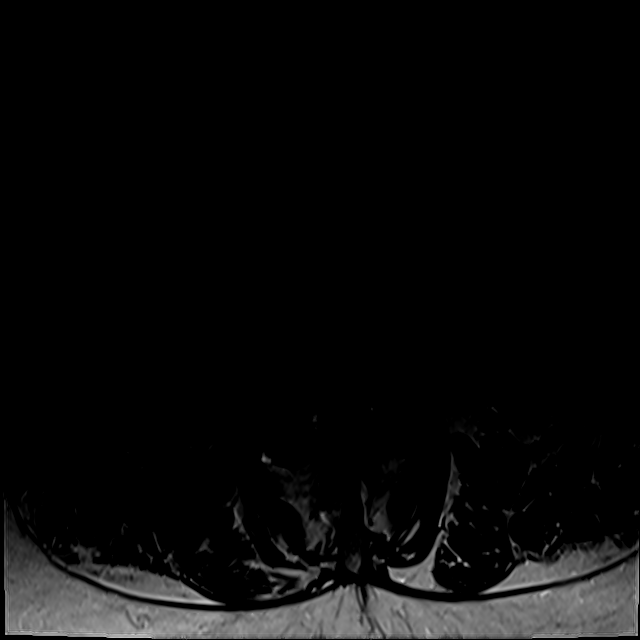
[im 19/37]
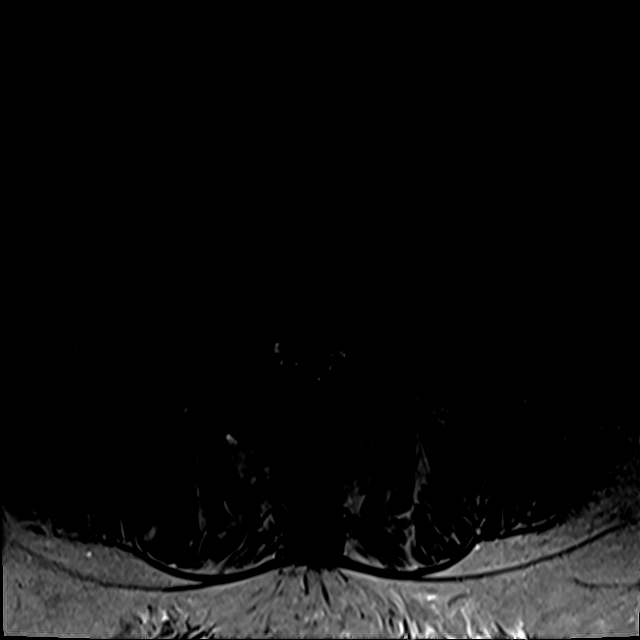
[im 31/37]
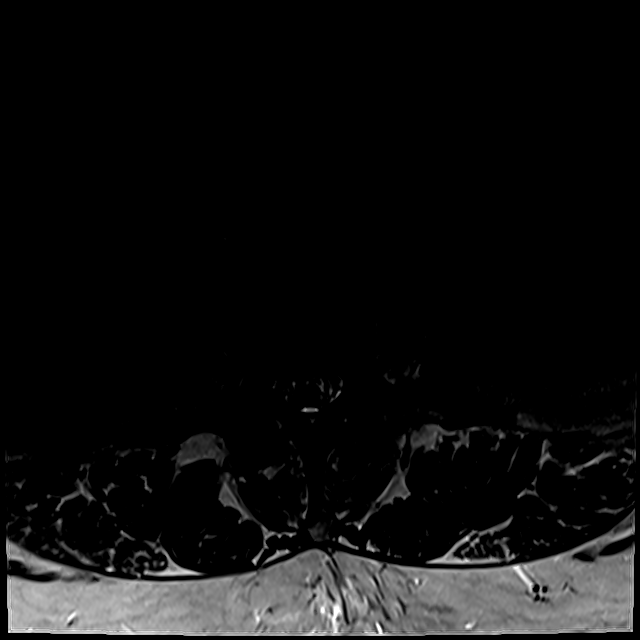

[18 of 48 positions shown; findings below may reference images not displayed]

FINDINGS: Segmentation:  Standard.

Alignment:  Physiologic.

Vertebrae:  No fracture, evidence of discitis, or bone lesion.

Conus medullaris and cauda equina: Conus extends to the L1 level.
Conus and cauda equina appear normal.

Paraspinal and other soft tissues: No acute paraspinal abnormality.

Disc levels:

Disc spaces: Degenerative disease with disc height loss at L4-5,
L5-S1 and to a lesser extent L3-4.

T12-L1: No significant disc bulge. No evidence of neural foraminal
stenosis. No central canal stenosis.

L1-L2: Mild broad-based disc bulge. Mild bilateral facet
arthropathy. No evidence of neural foraminal stenosis. No central
canal stenosis.

L2-L3: Mild broad-based disc bulge. Moderate bilateral facet
arthropathy. Mild bilateral foraminal stenosis. No central canal
stenosis.

L3-L4: Broad-based disc bulge. Moderate bilateral facet arthropathy.
Mild bilateral foraminal stenosis. Mild spinal stenosis.

L4-L5: Broad-based disc osteophyte complex. Moderate bilateral facet
arthropathy. Moderate spinal stenosis. Mild bilateral foraminal
stenosis.

L5-S1: Broad-based disc bulge with a left paracentral focal disc
osteophyte complex. Left subarticular recess stenosis. Moderate left
foraminal stenosis. No right foraminal stenosis. No central canal
stenosis.
IMPRESSION: 1. Diffuse lumbar spine spondylosis as described above.
2.  No acute osseous injury of the lumbar spine.

## 2019-11-27 DIAGNOSIS — R197 Diarrhea, unspecified: Secondary | ICD-10-CM | POA: Diagnosis not present

## 2019-11-27 DIAGNOSIS — R112 Nausea with vomiting, unspecified: Secondary | ICD-10-CM | POA: Diagnosis not present

## 2019-11-27 DIAGNOSIS — J310 Chronic rhinitis: Secondary | ICD-10-CM | POA: Diagnosis not present

## 2019-11-27 DIAGNOSIS — Z23 Encounter for immunization: Secondary | ICD-10-CM | POA: Diagnosis not present

## 2019-11-28 DIAGNOSIS — R197 Diarrhea, unspecified: Secondary | ICD-10-CM | POA: Diagnosis not present

## 2020-03-19 ENCOUNTER — Encounter: Payer: Self-pay | Admitting: *Deleted

## 2020-03-19 NOTE — Progress Notes (Signed)
    Pt was admitted to [facility]the [facility] division of [facility]--in the home on 03/19/20.  Pt will be receiving nurse visits 1-3 x/month and home-based social worker support as well.  Ellard Artis Referral Specialist [phone]

## 2020-03-26 DIAGNOSIS — N390 Urinary tract infection, site not specified: Secondary | ICD-10-CM | POA: Diagnosis not present

## 2020-05-03 DIAGNOSIS — L84 Corns and callosities: Secondary | ICD-10-CM | POA: Diagnosis not present

## 2020-05-03 DIAGNOSIS — M79672 Pain in left foot: Secondary | ICD-10-CM | POA: Diagnosis not present

## 2020-06-23 DIAGNOSIS — F4323 Adjustment disorder with mixed anxiety and depressed mood: Secondary | ICD-10-CM | POA: Diagnosis not present

## 2020-07-21 DIAGNOSIS — F4323 Adjustment disorder with mixed anxiety and depressed mood: Secondary | ICD-10-CM | POA: Diagnosis not present

## 2020-08-25 DIAGNOSIS — F4323 Adjustment disorder with mixed anxiety and depressed mood: Secondary | ICD-10-CM | POA: Diagnosis not present

## 2020-09-08 DIAGNOSIS — F4323 Adjustment disorder with mixed anxiety and depressed mood: Secondary | ICD-10-CM | POA: Diagnosis not present

## 2020-09-23 DIAGNOSIS — F4323 Adjustment disorder with mixed anxiety and depressed mood: Secondary | ICD-10-CM | POA: Diagnosis not present

## 2020-10-13 DIAGNOSIS — G2581 Restless legs syndrome: Secondary | ICD-10-CM | POA: Diagnosis not present

## 2020-10-13 DIAGNOSIS — G4733 Obstructive sleep apnea (adult) (pediatric): Secondary | ICD-10-CM | POA: Diagnosis not present

## 2020-10-13 DIAGNOSIS — M170 Bilateral primary osteoarthritis of knee: Secondary | ICD-10-CM | POA: Diagnosis not present

## 2020-10-13 DIAGNOSIS — I890 Lymphedema, not elsewhere classified: Secondary | ICD-10-CM | POA: Diagnosis not present

## 2020-10-13 DIAGNOSIS — M479 Spondylosis, unspecified: Secondary | ICD-10-CM | POA: Diagnosis not present

## 2020-10-13 DIAGNOSIS — M5432 Sciatica, left side: Secondary | ICD-10-CM | POA: Diagnosis not present

## 2020-10-13 DIAGNOSIS — K219 Gastro-esophageal reflux disease without esophagitis: Secondary | ICD-10-CM | POA: Diagnosis not present

## 2020-10-13 DIAGNOSIS — I509 Heart failure, unspecified: Secondary | ICD-10-CM | POA: Diagnosis not present

## 2020-10-13 DIAGNOSIS — K5900 Constipation, unspecified: Secondary | ICD-10-CM | POA: Diagnosis not present

## 2020-10-13 DIAGNOSIS — M353 Polymyalgia rheumatica: Secondary | ICD-10-CM | POA: Diagnosis not present

## 2020-10-13 DIAGNOSIS — Z9884 Bariatric surgery status: Secondary | ICD-10-CM | POA: Diagnosis not present

## 2020-10-13 DIAGNOSIS — F339 Major depressive disorder, recurrent, unspecified: Secondary | ICD-10-CM | POA: Diagnosis not present

## 2020-10-13 DIAGNOSIS — I110 Hypertensive heart disease with heart failure: Secondary | ICD-10-CM | POA: Diagnosis not present

## 2020-10-13 DIAGNOSIS — M160 Bilateral primary osteoarthritis of hip: Secondary | ICD-10-CM | POA: Diagnosis not present

## 2020-10-13 DIAGNOSIS — I2510 Atherosclerotic heart disease of native coronary artery without angina pectoris: Secondary | ICD-10-CM | POA: Diagnosis not present

## 2020-10-21 DIAGNOSIS — F4321 Adjustment disorder with depressed mood: Secondary | ICD-10-CM | POA: Diagnosis not present

## 2020-11-04 DIAGNOSIS — F4321 Adjustment disorder with depressed mood: Secondary | ICD-10-CM | POA: Diagnosis not present

## 2020-11-15 DIAGNOSIS — K219 Gastro-esophageal reflux disease without esophagitis: Secondary | ICD-10-CM | POA: Diagnosis not present

## 2020-11-15 DIAGNOSIS — M479 Spondylosis, unspecified: Secondary | ICD-10-CM | POA: Diagnosis not present

## 2020-11-15 DIAGNOSIS — Z9884 Bariatric surgery status: Secondary | ICD-10-CM | POA: Diagnosis not present

## 2020-11-15 DIAGNOSIS — M170 Bilateral primary osteoarthritis of knee: Secondary | ICD-10-CM | POA: Diagnosis not present

## 2020-11-15 DIAGNOSIS — I2510 Atherosclerotic heart disease of native coronary artery without angina pectoris: Secondary | ICD-10-CM | POA: Diagnosis not present

## 2020-11-15 DIAGNOSIS — G2581 Restless legs syndrome: Secondary | ICD-10-CM | POA: Diagnosis not present

## 2020-11-15 DIAGNOSIS — I110 Hypertensive heart disease with heart failure: Secondary | ICD-10-CM | POA: Diagnosis not present

## 2020-11-15 DIAGNOSIS — I890 Lymphedema, not elsewhere classified: Secondary | ICD-10-CM | POA: Diagnosis not present

## 2020-11-15 DIAGNOSIS — K5900 Constipation, unspecified: Secondary | ICD-10-CM | POA: Diagnosis not present

## 2020-11-15 DIAGNOSIS — M160 Bilateral primary osteoarthritis of hip: Secondary | ICD-10-CM | POA: Diagnosis not present

## 2020-11-15 DIAGNOSIS — G4733 Obstructive sleep apnea (adult) (pediatric): Secondary | ICD-10-CM | POA: Diagnosis not present

## 2020-11-15 DIAGNOSIS — I509 Heart failure, unspecified: Secondary | ICD-10-CM | POA: Diagnosis not present

## 2020-11-15 DIAGNOSIS — F339 Major depressive disorder, recurrent, unspecified: Secondary | ICD-10-CM | POA: Diagnosis not present

## 2020-11-15 DIAGNOSIS — M353 Polymyalgia rheumatica: Secondary | ICD-10-CM | POA: Diagnosis not present

## 2020-11-15 DIAGNOSIS — M5432 Sciatica, left side: Secondary | ICD-10-CM | POA: Diagnosis not present

## 2020-11-18 DIAGNOSIS — F4321 Adjustment disorder with depressed mood: Secondary | ICD-10-CM | POA: Diagnosis not present

## 2020-12-01 DIAGNOSIS — R2689 Other abnormalities of gait and mobility: Secondary | ICD-10-CM | POA: Diagnosis not present

## 2020-12-01 DIAGNOSIS — R54 Age-related physical debility: Secondary | ICD-10-CM | POA: Diagnosis not present

## 2020-12-01 DIAGNOSIS — Z6841 Body Mass Index (BMI) 40.0 and over, adult: Secondary | ICD-10-CM | POA: Diagnosis not present

## 2020-12-02 DIAGNOSIS — F331 Major depressive disorder, recurrent, moderate: Secondary | ICD-10-CM | POA: Diagnosis not present

## 2020-12-10 DIAGNOSIS — M5430 Sciatica, unspecified side: Secondary | ICD-10-CM | POA: Diagnosis not present

## 2020-12-10 DIAGNOSIS — R2689 Other abnormalities of gait and mobility: Secondary | ICD-10-CM | POA: Diagnosis not present

## 2020-12-10 DIAGNOSIS — M353 Polymyalgia rheumatica: Secondary | ICD-10-CM | POA: Diagnosis not present

## 2020-12-14 DIAGNOSIS — F339 Major depressive disorder, recurrent, unspecified: Secondary | ICD-10-CM | POA: Diagnosis not present

## 2020-12-14 DIAGNOSIS — G2581 Restless legs syndrome: Secondary | ICD-10-CM | POA: Diagnosis not present

## 2020-12-14 DIAGNOSIS — M5432 Sciatica, left side: Secondary | ICD-10-CM | POA: Diagnosis not present

## 2020-12-14 DIAGNOSIS — I509 Heart failure, unspecified: Secondary | ICD-10-CM | POA: Diagnosis not present

## 2020-12-14 DIAGNOSIS — L89312 Pressure ulcer of right buttock, stage 2: Secondary | ICD-10-CM | POA: Diagnosis not present

## 2020-12-14 DIAGNOSIS — L89151 Pressure ulcer of sacral region, stage 1: Secondary | ICD-10-CM | POA: Diagnosis not present

## 2020-12-14 DIAGNOSIS — I2510 Atherosclerotic heart disease of native coronary artery without angina pectoris: Secondary | ICD-10-CM | POA: Diagnosis not present

## 2020-12-14 DIAGNOSIS — M353 Polymyalgia rheumatica: Secondary | ICD-10-CM | POA: Diagnosis not present

## 2020-12-14 DIAGNOSIS — I110 Hypertensive heart disease with heart failure: Secondary | ICD-10-CM | POA: Diagnosis not present

## 2020-12-14 DIAGNOSIS — M160 Bilateral primary osteoarthritis of hip: Secondary | ICD-10-CM | POA: Diagnosis not present

## 2020-12-14 DIAGNOSIS — K219 Gastro-esophageal reflux disease without esophagitis: Secondary | ICD-10-CM | POA: Diagnosis not present

## 2020-12-14 DIAGNOSIS — I890 Lymphedema, not elsewhere classified: Secondary | ICD-10-CM | POA: Diagnosis not present

## 2020-12-14 DIAGNOSIS — G4733 Obstructive sleep apnea (adult) (pediatric): Secondary | ICD-10-CM | POA: Diagnosis not present

## 2020-12-14 DIAGNOSIS — M170 Bilateral primary osteoarthritis of knee: Secondary | ICD-10-CM | POA: Diagnosis not present

## 2020-12-14 DIAGNOSIS — M479 Spondylosis, unspecified: Secondary | ICD-10-CM | POA: Diagnosis not present

## 2020-12-16 DIAGNOSIS — F4321 Adjustment disorder with depressed mood: Secondary | ICD-10-CM | POA: Diagnosis not present

## 2020-12-22 DIAGNOSIS — Z6841 Body Mass Index (BMI) 40.0 and over, adult: Secondary | ICD-10-CM | POA: Diagnosis not present

## 2020-12-22 DIAGNOSIS — G2581 Restless legs syndrome: Secondary | ICD-10-CM | POA: Diagnosis not present

## 2020-12-22 DIAGNOSIS — K430 Incisional hernia with obstruction, without gangrene: Secondary | ICD-10-CM | POA: Diagnosis not present

## 2020-12-22 DIAGNOSIS — R000 Tachycardia, unspecified: Secondary | ICD-10-CM | POA: Diagnosis not present

## 2020-12-22 DIAGNOSIS — J101 Influenza due to other identified influenza virus with other respiratory manifestations: Secondary | ICD-10-CM | POA: Diagnosis not present

## 2020-12-22 DIAGNOSIS — F32A Depression, unspecified: Secondary | ICD-10-CM | POA: Diagnosis not present

## 2020-12-22 DIAGNOSIS — R112 Nausea with vomiting, unspecified: Secondary | ICD-10-CM | POA: Diagnosis not present

## 2020-12-22 DIAGNOSIS — Z743 Need for continuous supervision: Secondary | ICD-10-CM | POA: Diagnosis not present

## 2020-12-22 DIAGNOSIS — K56600 Partial intestinal obstruction, unspecified as to cause: Secondary | ICD-10-CM | POA: Diagnosis not present

## 2020-12-22 DIAGNOSIS — F331 Major depressive disorder, recurrent, moderate: Secondary | ICD-10-CM | POA: Diagnosis not present

## 2020-12-22 DIAGNOSIS — R109 Unspecified abdominal pain: Secondary | ICD-10-CM | POA: Diagnosis not present

## 2020-12-22 DIAGNOSIS — B9689 Other specified bacterial agents as the cause of diseases classified elsewhere: Secondary | ICD-10-CM | POA: Diagnosis not present

## 2020-12-22 DIAGNOSIS — E860 Dehydration: Secondary | ICD-10-CM | POA: Diagnosis not present

## 2020-12-22 DIAGNOSIS — K420 Umbilical hernia with obstruction, without gangrene: Secondary | ICD-10-CM | POA: Diagnosis not present

## 2020-12-22 DIAGNOSIS — N2889 Other specified disorders of kidney and ureter: Secondary | ICD-10-CM | POA: Diagnosis not present

## 2020-12-22 DIAGNOSIS — R1084 Generalized abdominal pain: Secondary | ICD-10-CM | POA: Diagnosis not present

## 2020-12-22 DIAGNOSIS — J9811 Atelectasis: Secondary | ICD-10-CM | POA: Diagnosis not present

## 2020-12-22 DIAGNOSIS — N390 Urinary tract infection, site not specified: Secondary | ICD-10-CM | POA: Diagnosis not present

## 2020-12-22 DIAGNOSIS — J811 Chronic pulmonary edema: Secondary | ICD-10-CM | POA: Diagnosis not present

## 2020-12-22 DIAGNOSIS — C641 Malignant neoplasm of right kidney, except renal pelvis: Secondary | ICD-10-CM | POA: Diagnosis not present

## 2020-12-22 DIAGNOSIS — I4510 Unspecified right bundle-branch block: Secondary | ICD-10-CM | POA: Diagnosis not present

## 2020-12-22 DIAGNOSIS — I4892 Unspecified atrial flutter: Secondary | ICD-10-CM | POA: Diagnosis not present

## 2020-12-22 DIAGNOSIS — K56609 Unspecified intestinal obstruction, unspecified as to partial versus complete obstruction: Secondary | ICD-10-CM | POA: Diagnosis not present

## 2020-12-22 DIAGNOSIS — R0789 Other chest pain: Secondary | ICD-10-CM | POA: Diagnosis not present

## 2020-12-22 DIAGNOSIS — Z20822 Contact with and (suspected) exposure to covid-19: Secondary | ICD-10-CM | POA: Diagnosis not present

## 2020-12-22 DIAGNOSIS — I110 Hypertensive heart disease with heart failure: Secondary | ICD-10-CM | POA: Diagnosis not present

## 2020-12-22 DIAGNOSIS — J09X2 Influenza due to identified novel influenza A virus with other respiratory manifestations: Secondary | ICD-10-CM | POA: Diagnosis not present

## 2020-12-22 DIAGNOSIS — R079 Chest pain, unspecified: Secondary | ICD-10-CM | POA: Diagnosis not present

## 2020-12-22 DIAGNOSIS — I4891 Unspecified atrial fibrillation: Secondary | ICD-10-CM | POA: Diagnosis not present

## 2020-12-22 DIAGNOSIS — Z9049 Acquired absence of other specified parts of digestive tract: Secondary | ICD-10-CM | POA: Diagnosis not present

## 2020-12-22 DIAGNOSIS — I10 Essential (primary) hypertension: Secondary | ICD-10-CM | POA: Diagnosis not present

## 2020-12-22 DIAGNOSIS — K439 Ventral hernia without obstruction or gangrene: Secondary | ICD-10-CM | POA: Diagnosis not present

## 2020-12-22 DIAGNOSIS — K6389 Other specified diseases of intestine: Secondary | ICD-10-CM | POA: Diagnosis not present

## 2020-12-22 DIAGNOSIS — Z66 Do not resuscitate: Secondary | ICD-10-CM | POA: Diagnosis not present

## 2020-12-22 DIAGNOSIS — K429 Umbilical hernia without obstruction or gangrene: Secondary | ICD-10-CM | POA: Diagnosis not present

## 2020-12-22 DIAGNOSIS — R0602 Shortness of breath: Secondary | ICD-10-CM | POA: Diagnosis not present

## 2020-12-22 DIAGNOSIS — Z4682 Encounter for fitting and adjustment of non-vascular catheter: Secondary | ICD-10-CM | POA: Diagnosis not present

## 2020-12-22 DIAGNOSIS — E662 Morbid (severe) obesity with alveolar hypoventilation: Secondary | ICD-10-CM | POA: Diagnosis not present

## 2020-12-22 DIAGNOSIS — E871 Hypo-osmolality and hyponatremia: Secondary | ICD-10-CM | POA: Diagnosis not present

## 2020-12-22 DIAGNOSIS — K432 Incisional hernia without obstruction or gangrene: Secondary | ICD-10-CM | POA: Diagnosis not present

## 2020-12-22 DIAGNOSIS — K436 Other and unspecified ventral hernia with obstruction, without gangrene: Secondary | ICD-10-CM | POA: Diagnosis not present

## 2020-12-22 DIAGNOSIS — E785 Hyperlipidemia, unspecified: Secondary | ICD-10-CM | POA: Diagnosis not present

## 2020-12-22 DIAGNOSIS — R7989 Other specified abnormal findings of blood chemistry: Secondary | ICD-10-CM | POA: Diagnosis not present

## 2020-12-22 DIAGNOSIS — K3189 Other diseases of stomach and duodenum: Secondary | ICD-10-CM | POA: Diagnosis not present

## 2020-12-22 DIAGNOSIS — R531 Weakness: Secondary | ICD-10-CM | POA: Diagnosis not present

## 2020-12-22 DIAGNOSIS — Z9889 Other specified postprocedural states: Secondary | ICD-10-CM | POA: Diagnosis not present

## 2020-12-22 DIAGNOSIS — I248 Other forms of acute ischemic heart disease: Secondary | ICD-10-CM | POA: Diagnosis not present

## 2020-12-30 DIAGNOSIS — F331 Major depressive disorder, recurrent, moderate: Secondary | ICD-10-CM | POA: Diagnosis not present

## 2021-01-01 DIAGNOSIS — G9009 Other idiopathic peripheral autonomic neuropathy: Secondary | ICD-10-CM | POA: Diagnosis not present

## 2021-01-01 DIAGNOSIS — I10 Essential (primary) hypertension: Secondary | ICD-10-CM | POA: Diagnosis not present

## 2021-01-01 DIAGNOSIS — K56699 Other intestinal obstruction unspecified as to partial versus complete obstruction: Secondary | ICD-10-CM | POA: Diagnosis not present

## 2021-01-01 DIAGNOSIS — G629 Polyneuropathy, unspecified: Secondary | ICD-10-CM | POA: Diagnosis not present

## 2021-01-01 DIAGNOSIS — M797 Fibromyalgia: Secondary | ICD-10-CM | POA: Diagnosis not present

## 2021-01-01 DIAGNOSIS — E785 Hyperlipidemia, unspecified: Secondary | ICD-10-CM | POA: Diagnosis not present

## 2021-01-01 DIAGNOSIS — N390 Urinary tract infection, site not specified: Secondary | ICD-10-CM | POA: Diagnosis not present

## 2021-01-01 DIAGNOSIS — R262 Difficulty in walking, not elsewhere classified: Secondary | ICD-10-CM | POA: Diagnosis not present

## 2021-01-01 DIAGNOSIS — R531 Weakness: Secondary | ICD-10-CM | POA: Diagnosis not present

## 2021-01-01 DIAGNOSIS — N2889 Other specified disorders of kidney and ureter: Secondary | ICD-10-CM | POA: Diagnosis not present

## 2021-01-01 DIAGNOSIS — R2689 Other abnormalities of gait and mobility: Secondary | ICD-10-CM | POA: Diagnosis not present

## 2021-01-01 DIAGNOSIS — I483 Typical atrial flutter: Secondary | ICD-10-CM | POA: Diagnosis not present

## 2021-01-01 DIAGNOSIS — G2581 Restless legs syndrome: Secondary | ICD-10-CM | POA: Diagnosis not present

## 2021-01-01 DIAGNOSIS — K436 Other and unspecified ventral hernia with obstruction, without gangrene: Secondary | ICD-10-CM | POA: Diagnosis not present

## 2021-01-01 DIAGNOSIS — M5430 Sciatica, unspecified side: Secondary | ICD-10-CM | POA: Diagnosis not present

## 2021-01-01 DIAGNOSIS — F32A Depression, unspecified: Secondary | ICD-10-CM | POA: Diagnosis not present

## 2021-01-01 DIAGNOSIS — M6281 Muscle weakness (generalized): Secondary | ICD-10-CM | POA: Diagnosis not present

## 2021-01-01 DIAGNOSIS — Z741 Need for assistance with personal care: Secondary | ICD-10-CM | POA: Diagnosis not present

## 2021-01-01 DIAGNOSIS — Z743 Need for continuous supervision: Secondary | ICD-10-CM | POA: Diagnosis not present

## 2021-01-01 DIAGNOSIS — M353 Polymyalgia rheumatica: Secondary | ICD-10-CM | POA: Diagnosis not present

## 2021-01-01 DIAGNOSIS — K420 Umbilical hernia with obstruction, without gangrene: Secondary | ICD-10-CM | POA: Diagnosis not present

## 2021-01-03 DIAGNOSIS — E785 Hyperlipidemia, unspecified: Secondary | ICD-10-CM | POA: Diagnosis not present

## 2021-01-03 DIAGNOSIS — I10 Essential (primary) hypertension: Secondary | ICD-10-CM | POA: Diagnosis not present

## 2021-01-03 DIAGNOSIS — F32A Depression, unspecified: Secondary | ICD-10-CM | POA: Diagnosis not present

## 2021-01-03 DIAGNOSIS — G629 Polyneuropathy, unspecified: Secondary | ICD-10-CM | POA: Diagnosis not present

## 2021-01-04 DIAGNOSIS — N390 Urinary tract infection, site not specified: Secondary | ICD-10-CM | POA: Diagnosis not present

## 2021-01-04 DIAGNOSIS — N2889 Other specified disorders of kidney and ureter: Secondary | ICD-10-CM | POA: Diagnosis not present

## 2021-01-04 DIAGNOSIS — K436 Other and unspecified ventral hernia with obstruction, without gangrene: Secondary | ICD-10-CM | POA: Diagnosis not present

## 2021-01-04 DIAGNOSIS — I10 Essential (primary) hypertension: Secondary | ICD-10-CM | POA: Diagnosis not present

## 2021-01-18 DIAGNOSIS — M797 Fibromyalgia: Secondary | ICD-10-CM | POA: Diagnosis not present

## 2021-01-18 DIAGNOSIS — I10 Essential (primary) hypertension: Secondary | ICD-10-CM | POA: Diagnosis not present

## 2021-01-18 DIAGNOSIS — G2581 Restless legs syndrome: Secondary | ICD-10-CM | POA: Diagnosis not present

## 2021-01-18 DIAGNOSIS — F32A Depression, unspecified: Secondary | ICD-10-CM | POA: Diagnosis not present

## 2021-01-21 DIAGNOSIS — N2889 Other specified disorders of kidney and ureter: Secondary | ICD-10-CM | POA: Diagnosis not present

## 2021-01-21 DIAGNOSIS — F32A Depression, unspecified: Secondary | ICD-10-CM | POA: Diagnosis not present

## 2021-01-21 DIAGNOSIS — Z7952 Long term (current) use of systemic steroids: Secondary | ICD-10-CM | POA: Diagnosis not present

## 2021-01-21 DIAGNOSIS — I10 Essential (primary) hypertension: Secondary | ICD-10-CM | POA: Diagnosis not present

## 2021-01-21 DIAGNOSIS — J101 Influenza due to other identified influenza virus with other respiratory manifestations: Secondary | ICD-10-CM | POA: Diagnosis not present

## 2021-01-21 DIAGNOSIS — G2581 Restless legs syndrome: Secondary | ICD-10-CM | POA: Diagnosis not present

## 2021-01-21 DIAGNOSIS — Z6841 Body Mass Index (BMI) 40.0 and over, adult: Secondary | ICD-10-CM | POA: Diagnosis not present

## 2021-01-21 DIAGNOSIS — E785 Hyperlipidemia, unspecified: Secondary | ICD-10-CM | POA: Diagnosis not present

## 2021-01-21 DIAGNOSIS — G629 Polyneuropathy, unspecified: Secondary | ICD-10-CM | POA: Diagnosis not present

## 2021-01-21 DIAGNOSIS — K436 Other and unspecified ventral hernia with obstruction, without gangrene: Secondary | ICD-10-CM | POA: Diagnosis not present

## 2021-01-21 DIAGNOSIS — R5381 Other malaise: Secondary | ICD-10-CM | POA: Diagnosis not present

## 2021-01-21 DIAGNOSIS — M797 Fibromyalgia: Secondary | ICD-10-CM | POA: Diagnosis not present

## 2021-01-21 DIAGNOSIS — I4892 Unspecified atrial flutter: Secondary | ICD-10-CM | POA: Diagnosis not present

## 2021-01-21 DIAGNOSIS — R2689 Other abnormalities of gait and mobility: Secondary | ICD-10-CM | POA: Diagnosis not present

## 2021-01-21 DIAGNOSIS — Z9181 History of falling: Secondary | ICD-10-CM | POA: Diagnosis not present

## 2021-01-24 DIAGNOSIS — J101 Influenza due to other identified influenza virus with other respiratory manifestations: Secondary | ICD-10-CM | POA: Diagnosis not present

## 2021-01-24 DIAGNOSIS — Z7952 Long term (current) use of systemic steroids: Secondary | ICD-10-CM | POA: Diagnosis not present

## 2021-01-24 DIAGNOSIS — F32A Depression, unspecified: Secondary | ICD-10-CM | POA: Diagnosis not present

## 2021-01-24 DIAGNOSIS — E785 Hyperlipidemia, unspecified: Secondary | ICD-10-CM | POA: Diagnosis not present

## 2021-01-24 DIAGNOSIS — R2689 Other abnormalities of gait and mobility: Secondary | ICD-10-CM | POA: Diagnosis not present

## 2021-01-24 DIAGNOSIS — G2581 Restless legs syndrome: Secondary | ICD-10-CM | POA: Diagnosis not present

## 2021-01-24 DIAGNOSIS — I4892 Unspecified atrial flutter: Secondary | ICD-10-CM | POA: Diagnosis not present

## 2021-01-24 DIAGNOSIS — G629 Polyneuropathy, unspecified: Secondary | ICD-10-CM | POA: Diagnosis not present

## 2021-01-24 DIAGNOSIS — Z9181 History of falling: Secondary | ICD-10-CM | POA: Diagnosis not present

## 2021-01-24 DIAGNOSIS — K436 Other and unspecified ventral hernia with obstruction, without gangrene: Secondary | ICD-10-CM | POA: Diagnosis not present

## 2021-01-24 DIAGNOSIS — N2889 Other specified disorders of kidney and ureter: Secondary | ICD-10-CM | POA: Diagnosis not present

## 2021-01-24 DIAGNOSIS — M797 Fibromyalgia: Secondary | ICD-10-CM | POA: Diagnosis not present

## 2021-01-24 DIAGNOSIS — Z6841 Body Mass Index (BMI) 40.0 and over, adult: Secondary | ICD-10-CM | POA: Diagnosis not present

## 2021-01-24 DIAGNOSIS — I10 Essential (primary) hypertension: Secondary | ICD-10-CM | POA: Diagnosis not present

## 2021-01-24 DIAGNOSIS — R5381 Other malaise: Secondary | ICD-10-CM | POA: Diagnosis not present

## 2021-01-28 DIAGNOSIS — Z9181 History of falling: Secondary | ICD-10-CM | POA: Diagnosis not present

## 2021-01-28 DIAGNOSIS — I10 Essential (primary) hypertension: Secondary | ICD-10-CM | POA: Diagnosis not present

## 2021-01-28 DIAGNOSIS — J101 Influenza due to other identified influenza virus with other respiratory manifestations: Secondary | ICD-10-CM | POA: Diagnosis not present

## 2021-01-28 DIAGNOSIS — G2581 Restless legs syndrome: Secondary | ICD-10-CM | POA: Diagnosis not present

## 2021-01-28 DIAGNOSIS — R2689 Other abnormalities of gait and mobility: Secondary | ICD-10-CM | POA: Diagnosis not present

## 2021-01-28 DIAGNOSIS — I4892 Unspecified atrial flutter: Secondary | ICD-10-CM | POA: Diagnosis not present

## 2021-01-28 DIAGNOSIS — R5381 Other malaise: Secondary | ICD-10-CM | POA: Diagnosis not present

## 2021-01-28 DIAGNOSIS — Z7952 Long term (current) use of systemic steroids: Secondary | ICD-10-CM | POA: Diagnosis not present

## 2021-01-28 DIAGNOSIS — K436 Other and unspecified ventral hernia with obstruction, without gangrene: Secondary | ICD-10-CM | POA: Diagnosis not present

## 2021-01-28 DIAGNOSIS — Z6841 Body Mass Index (BMI) 40.0 and over, adult: Secondary | ICD-10-CM | POA: Diagnosis not present

## 2021-01-28 DIAGNOSIS — M797 Fibromyalgia: Secondary | ICD-10-CM | POA: Diagnosis not present

## 2021-01-28 DIAGNOSIS — F32A Depression, unspecified: Secondary | ICD-10-CM | POA: Diagnosis not present

## 2021-01-28 DIAGNOSIS — G629 Polyneuropathy, unspecified: Secondary | ICD-10-CM | POA: Diagnosis not present

## 2021-01-28 DIAGNOSIS — N2889 Other specified disorders of kidney and ureter: Secondary | ICD-10-CM | POA: Diagnosis not present

## 2021-01-28 DIAGNOSIS — E785 Hyperlipidemia, unspecified: Secondary | ICD-10-CM | POA: Diagnosis not present

## 2021-01-31 DIAGNOSIS — J101 Influenza due to other identified influenza virus with other respiratory manifestations: Secondary | ICD-10-CM | POA: Diagnosis not present

## 2021-01-31 DIAGNOSIS — M797 Fibromyalgia: Secondary | ICD-10-CM | POA: Diagnosis not present

## 2021-01-31 DIAGNOSIS — G2581 Restless legs syndrome: Secondary | ICD-10-CM | POA: Diagnosis not present

## 2021-01-31 DIAGNOSIS — E785 Hyperlipidemia, unspecified: Secondary | ICD-10-CM | POA: Diagnosis not present

## 2021-01-31 DIAGNOSIS — Z9181 History of falling: Secondary | ICD-10-CM | POA: Diagnosis not present

## 2021-01-31 DIAGNOSIS — R2689 Other abnormalities of gait and mobility: Secondary | ICD-10-CM | POA: Diagnosis not present

## 2021-01-31 DIAGNOSIS — I10 Essential (primary) hypertension: Secondary | ICD-10-CM | POA: Diagnosis not present

## 2021-01-31 DIAGNOSIS — G629 Polyneuropathy, unspecified: Secondary | ICD-10-CM | POA: Diagnosis not present

## 2021-01-31 DIAGNOSIS — F32A Depression, unspecified: Secondary | ICD-10-CM | POA: Diagnosis not present

## 2021-01-31 DIAGNOSIS — N2889 Other specified disorders of kidney and ureter: Secondary | ICD-10-CM | POA: Diagnosis not present

## 2021-01-31 DIAGNOSIS — K436 Other and unspecified ventral hernia with obstruction, without gangrene: Secondary | ICD-10-CM | POA: Diagnosis not present

## 2021-01-31 DIAGNOSIS — Z6841 Body Mass Index (BMI) 40.0 and over, adult: Secondary | ICD-10-CM | POA: Diagnosis not present

## 2021-01-31 DIAGNOSIS — I4892 Unspecified atrial flutter: Secondary | ICD-10-CM | POA: Diagnosis not present

## 2021-01-31 DIAGNOSIS — R5381 Other malaise: Secondary | ICD-10-CM | POA: Diagnosis not present

## 2021-01-31 DIAGNOSIS — Z7952 Long term (current) use of systemic steroids: Secondary | ICD-10-CM | POA: Diagnosis not present

## 2021-02-02 DIAGNOSIS — I4892 Unspecified atrial flutter: Secondary | ICD-10-CM | POA: Diagnosis not present

## 2021-02-02 DIAGNOSIS — N2889 Other specified disorders of kidney and ureter: Secondary | ICD-10-CM | POA: Diagnosis not present

## 2021-02-02 DIAGNOSIS — G2581 Restless legs syndrome: Secondary | ICD-10-CM | POA: Diagnosis not present

## 2021-02-02 DIAGNOSIS — J101 Influenza due to other identified influenza virus with other respiratory manifestations: Secondary | ICD-10-CM | POA: Diagnosis not present

## 2021-02-02 DIAGNOSIS — K436 Other and unspecified ventral hernia with obstruction, without gangrene: Secondary | ICD-10-CM | POA: Diagnosis not present

## 2021-02-02 DIAGNOSIS — G629 Polyneuropathy, unspecified: Secondary | ICD-10-CM | POA: Diagnosis not present

## 2021-02-02 DIAGNOSIS — R2689 Other abnormalities of gait and mobility: Secondary | ICD-10-CM | POA: Diagnosis not present

## 2021-02-02 DIAGNOSIS — Z9181 History of falling: Secondary | ICD-10-CM | POA: Diagnosis not present

## 2021-02-02 DIAGNOSIS — Z6841 Body Mass Index (BMI) 40.0 and over, adult: Secondary | ICD-10-CM | POA: Diagnosis not present

## 2021-02-02 DIAGNOSIS — F32A Depression, unspecified: Secondary | ICD-10-CM | POA: Diagnosis not present

## 2021-02-02 DIAGNOSIS — E785 Hyperlipidemia, unspecified: Secondary | ICD-10-CM | POA: Diagnosis not present

## 2021-02-02 DIAGNOSIS — M797 Fibromyalgia: Secondary | ICD-10-CM | POA: Diagnosis not present

## 2021-02-02 DIAGNOSIS — Z7952 Long term (current) use of systemic steroids: Secondary | ICD-10-CM | POA: Diagnosis not present

## 2021-02-02 DIAGNOSIS — R5381 Other malaise: Secondary | ICD-10-CM | POA: Diagnosis not present

## 2021-02-02 DIAGNOSIS — I10 Essential (primary) hypertension: Secondary | ICD-10-CM | POA: Diagnosis not present

## 2021-02-04 DIAGNOSIS — G2581 Restless legs syndrome: Secondary | ICD-10-CM | POA: Diagnosis not present

## 2021-02-04 DIAGNOSIS — G629 Polyneuropathy, unspecified: Secondary | ICD-10-CM | POA: Diagnosis not present

## 2021-02-04 DIAGNOSIS — J101 Influenza due to other identified influenza virus with other respiratory manifestations: Secondary | ICD-10-CM | POA: Diagnosis not present

## 2021-02-04 DIAGNOSIS — R5381 Other malaise: Secondary | ICD-10-CM | POA: Diagnosis not present

## 2021-02-04 DIAGNOSIS — Z6841 Body Mass Index (BMI) 40.0 and over, adult: Secondary | ICD-10-CM | POA: Diagnosis not present

## 2021-02-04 DIAGNOSIS — I4892 Unspecified atrial flutter: Secondary | ICD-10-CM | POA: Diagnosis not present

## 2021-02-04 DIAGNOSIS — N2889 Other specified disorders of kidney and ureter: Secondary | ICD-10-CM | POA: Diagnosis not present

## 2021-02-04 DIAGNOSIS — Z7952 Long term (current) use of systemic steroids: Secondary | ICD-10-CM | POA: Diagnosis not present

## 2021-02-04 DIAGNOSIS — R2689 Other abnormalities of gait and mobility: Secondary | ICD-10-CM | POA: Diagnosis not present

## 2021-02-04 DIAGNOSIS — Z9181 History of falling: Secondary | ICD-10-CM | POA: Diagnosis not present

## 2021-02-04 DIAGNOSIS — I10 Essential (primary) hypertension: Secondary | ICD-10-CM | POA: Diagnosis not present

## 2021-02-04 DIAGNOSIS — F32A Depression, unspecified: Secondary | ICD-10-CM | POA: Diagnosis not present

## 2021-02-04 DIAGNOSIS — K436 Other and unspecified ventral hernia with obstruction, without gangrene: Secondary | ICD-10-CM | POA: Diagnosis not present

## 2021-02-04 DIAGNOSIS — E785 Hyperlipidemia, unspecified: Secondary | ICD-10-CM | POA: Diagnosis not present

## 2021-02-04 DIAGNOSIS — M797 Fibromyalgia: Secondary | ICD-10-CM | POA: Diagnosis not present

## 2021-02-07 DIAGNOSIS — G629 Polyneuropathy, unspecified: Secondary | ICD-10-CM | POA: Diagnosis not present

## 2021-02-07 DIAGNOSIS — K436 Other and unspecified ventral hernia with obstruction, without gangrene: Secondary | ICD-10-CM | POA: Diagnosis not present

## 2021-02-07 DIAGNOSIS — Z7952 Long term (current) use of systemic steroids: Secondary | ICD-10-CM | POA: Diagnosis not present

## 2021-02-07 DIAGNOSIS — F32A Depression, unspecified: Secondary | ICD-10-CM | POA: Diagnosis not present

## 2021-02-07 DIAGNOSIS — I10 Essential (primary) hypertension: Secondary | ICD-10-CM | POA: Diagnosis not present

## 2021-02-07 DIAGNOSIS — J101 Influenza due to other identified influenza virus with other respiratory manifestations: Secondary | ICD-10-CM | POA: Diagnosis not present

## 2021-02-07 DIAGNOSIS — N2889 Other specified disorders of kidney and ureter: Secondary | ICD-10-CM | POA: Diagnosis not present

## 2021-02-07 DIAGNOSIS — R2689 Other abnormalities of gait and mobility: Secondary | ICD-10-CM | POA: Diagnosis not present

## 2021-02-07 DIAGNOSIS — I4892 Unspecified atrial flutter: Secondary | ICD-10-CM | POA: Diagnosis not present

## 2021-02-07 DIAGNOSIS — G2581 Restless legs syndrome: Secondary | ICD-10-CM | POA: Diagnosis not present

## 2021-02-07 DIAGNOSIS — E785 Hyperlipidemia, unspecified: Secondary | ICD-10-CM | POA: Diagnosis not present

## 2021-02-07 DIAGNOSIS — R5381 Other malaise: Secondary | ICD-10-CM | POA: Diagnosis not present

## 2021-02-07 DIAGNOSIS — Z6841 Body Mass Index (BMI) 40.0 and over, adult: Secondary | ICD-10-CM | POA: Diagnosis not present

## 2021-02-07 DIAGNOSIS — Z9181 History of falling: Secondary | ICD-10-CM | POA: Diagnosis not present

## 2021-02-07 DIAGNOSIS — M797 Fibromyalgia: Secondary | ICD-10-CM | POA: Diagnosis not present

## 2021-02-09 DIAGNOSIS — M5430 Sciatica, unspecified side: Secondary | ICD-10-CM | POA: Diagnosis not present

## 2021-02-09 DIAGNOSIS — M353 Polymyalgia rheumatica: Secondary | ICD-10-CM | POA: Diagnosis not present

## 2021-02-09 DIAGNOSIS — R2689 Other abnormalities of gait and mobility: Secondary | ICD-10-CM | POA: Diagnosis not present

## 2021-02-11 DIAGNOSIS — Z7952 Long term (current) use of systemic steroids: Secondary | ICD-10-CM | POA: Diagnosis not present

## 2021-02-11 DIAGNOSIS — R2689 Other abnormalities of gait and mobility: Secondary | ICD-10-CM | POA: Diagnosis not present

## 2021-02-11 DIAGNOSIS — G629 Polyneuropathy, unspecified: Secondary | ICD-10-CM | POA: Diagnosis not present

## 2021-02-11 DIAGNOSIS — Z9181 History of falling: Secondary | ICD-10-CM | POA: Diagnosis not present

## 2021-02-11 DIAGNOSIS — N2889 Other specified disorders of kidney and ureter: Secondary | ICD-10-CM | POA: Diagnosis not present

## 2021-02-11 DIAGNOSIS — R5381 Other malaise: Secondary | ICD-10-CM | POA: Diagnosis not present

## 2021-02-11 DIAGNOSIS — M797 Fibromyalgia: Secondary | ICD-10-CM | POA: Diagnosis not present

## 2021-02-11 DIAGNOSIS — J101 Influenza due to other identified influenza virus with other respiratory manifestations: Secondary | ICD-10-CM | POA: Diagnosis not present

## 2021-02-11 DIAGNOSIS — F32A Depression, unspecified: Secondary | ICD-10-CM | POA: Diagnosis not present

## 2021-02-11 DIAGNOSIS — G2581 Restless legs syndrome: Secondary | ICD-10-CM | POA: Diagnosis not present

## 2021-02-11 DIAGNOSIS — E785 Hyperlipidemia, unspecified: Secondary | ICD-10-CM | POA: Diagnosis not present

## 2021-02-11 DIAGNOSIS — I10 Essential (primary) hypertension: Secondary | ICD-10-CM | POA: Diagnosis not present

## 2021-02-11 DIAGNOSIS — I4892 Unspecified atrial flutter: Secondary | ICD-10-CM | POA: Diagnosis not present

## 2021-02-11 DIAGNOSIS — K436 Other and unspecified ventral hernia with obstruction, without gangrene: Secondary | ICD-10-CM | POA: Diagnosis not present

## 2021-02-11 DIAGNOSIS — Z6841 Body Mass Index (BMI) 40.0 and over, adult: Secondary | ICD-10-CM | POA: Diagnosis not present

## 2021-02-15 DIAGNOSIS — R5381 Other malaise: Secondary | ICD-10-CM | POA: Diagnosis not present

## 2021-02-15 DIAGNOSIS — Z9181 History of falling: Secondary | ICD-10-CM | POA: Diagnosis not present

## 2021-02-15 DIAGNOSIS — F32A Depression, unspecified: Secondary | ICD-10-CM | POA: Diagnosis not present

## 2021-02-15 DIAGNOSIS — M797 Fibromyalgia: Secondary | ICD-10-CM | POA: Diagnosis not present

## 2021-02-15 DIAGNOSIS — J101 Influenza due to other identified influenza virus with other respiratory manifestations: Secondary | ICD-10-CM | POA: Diagnosis not present

## 2021-02-15 DIAGNOSIS — R2689 Other abnormalities of gait and mobility: Secondary | ICD-10-CM | POA: Diagnosis not present

## 2021-02-15 DIAGNOSIS — G2581 Restless legs syndrome: Secondary | ICD-10-CM | POA: Diagnosis not present

## 2021-02-15 DIAGNOSIS — E785 Hyperlipidemia, unspecified: Secondary | ICD-10-CM | POA: Diagnosis not present

## 2021-02-15 DIAGNOSIS — N2889 Other specified disorders of kidney and ureter: Secondary | ICD-10-CM | POA: Diagnosis not present

## 2021-02-15 DIAGNOSIS — I4892 Unspecified atrial flutter: Secondary | ICD-10-CM | POA: Diagnosis not present

## 2021-02-15 DIAGNOSIS — Z7952 Long term (current) use of systemic steroids: Secondary | ICD-10-CM | POA: Diagnosis not present

## 2021-02-15 DIAGNOSIS — I10 Essential (primary) hypertension: Secondary | ICD-10-CM | POA: Diagnosis not present

## 2021-02-15 DIAGNOSIS — G629 Polyneuropathy, unspecified: Secondary | ICD-10-CM | POA: Diagnosis not present

## 2021-02-15 DIAGNOSIS — K436 Other and unspecified ventral hernia with obstruction, without gangrene: Secondary | ICD-10-CM | POA: Diagnosis not present

## 2021-02-15 DIAGNOSIS — Z6841 Body Mass Index (BMI) 40.0 and over, adult: Secondary | ICD-10-CM | POA: Diagnosis not present

## 2021-02-18 DIAGNOSIS — G2581 Restless legs syndrome: Secondary | ICD-10-CM | POA: Diagnosis not present

## 2021-02-18 DIAGNOSIS — G629 Polyneuropathy, unspecified: Secondary | ICD-10-CM | POA: Diagnosis not present

## 2021-02-18 DIAGNOSIS — R2689 Other abnormalities of gait and mobility: Secondary | ICD-10-CM | POA: Diagnosis not present

## 2021-02-18 DIAGNOSIS — F32A Depression, unspecified: Secondary | ICD-10-CM | POA: Diagnosis not present

## 2021-02-18 DIAGNOSIS — N2889 Other specified disorders of kidney and ureter: Secondary | ICD-10-CM | POA: Diagnosis not present

## 2021-02-18 DIAGNOSIS — E785 Hyperlipidemia, unspecified: Secondary | ICD-10-CM | POA: Diagnosis not present

## 2021-02-18 DIAGNOSIS — M797 Fibromyalgia: Secondary | ICD-10-CM | POA: Diagnosis not present

## 2021-02-18 DIAGNOSIS — Z7952 Long term (current) use of systemic steroids: Secondary | ICD-10-CM | POA: Diagnosis not present

## 2021-02-18 DIAGNOSIS — I10 Essential (primary) hypertension: Secondary | ICD-10-CM | POA: Diagnosis not present

## 2021-02-18 DIAGNOSIS — J101 Influenza due to other identified influenza virus with other respiratory manifestations: Secondary | ICD-10-CM | POA: Diagnosis not present

## 2021-02-18 DIAGNOSIS — Z9181 History of falling: Secondary | ICD-10-CM | POA: Diagnosis not present

## 2021-02-18 DIAGNOSIS — K436 Other and unspecified ventral hernia with obstruction, without gangrene: Secondary | ICD-10-CM | POA: Diagnosis not present

## 2021-02-18 DIAGNOSIS — R5381 Other malaise: Secondary | ICD-10-CM | POA: Diagnosis not present

## 2021-02-18 DIAGNOSIS — I4892 Unspecified atrial flutter: Secondary | ICD-10-CM | POA: Diagnosis not present

## 2021-02-18 DIAGNOSIS — Z6841 Body Mass Index (BMI) 40.0 and over, adult: Secondary | ICD-10-CM | POA: Diagnosis not present

## 2021-02-23 DIAGNOSIS — K435 Parastomal hernia without obstruction or  gangrene: Secondary | ICD-10-CM | POA: Diagnosis not present

## 2021-02-23 DIAGNOSIS — I10 Essential (primary) hypertension: Secondary | ICD-10-CM | POA: Diagnosis not present

## 2021-02-23 DIAGNOSIS — I4892 Unspecified atrial flutter: Secondary | ICD-10-CM | POA: Diagnosis not present

## 2021-02-23 DIAGNOSIS — J101 Influenza due to other identified influenza virus with other respiratory manifestations: Secondary | ICD-10-CM | POA: Diagnosis not present

## 2021-02-25 DIAGNOSIS — Z9181 History of falling: Secondary | ICD-10-CM | POA: Diagnosis not present

## 2021-02-25 DIAGNOSIS — R2689 Other abnormalities of gait and mobility: Secondary | ICD-10-CM | POA: Diagnosis not present

## 2021-02-25 DIAGNOSIS — M797 Fibromyalgia: Secondary | ICD-10-CM | POA: Diagnosis not present

## 2021-02-25 DIAGNOSIS — K436 Other and unspecified ventral hernia with obstruction, without gangrene: Secondary | ICD-10-CM | POA: Diagnosis not present

## 2021-02-25 DIAGNOSIS — Z7952 Long term (current) use of systemic steroids: Secondary | ICD-10-CM | POA: Diagnosis not present

## 2021-02-25 DIAGNOSIS — I4892 Unspecified atrial flutter: Secondary | ICD-10-CM | POA: Diagnosis not present

## 2021-02-25 DIAGNOSIS — F32A Depression, unspecified: Secondary | ICD-10-CM | POA: Diagnosis not present

## 2021-02-25 DIAGNOSIS — E785 Hyperlipidemia, unspecified: Secondary | ICD-10-CM | POA: Diagnosis not present

## 2021-02-25 DIAGNOSIS — R5381 Other malaise: Secondary | ICD-10-CM | POA: Diagnosis not present

## 2021-02-25 DIAGNOSIS — I10 Essential (primary) hypertension: Secondary | ICD-10-CM | POA: Diagnosis not present

## 2021-02-25 DIAGNOSIS — G629 Polyneuropathy, unspecified: Secondary | ICD-10-CM | POA: Diagnosis not present

## 2021-02-25 DIAGNOSIS — G2581 Restless legs syndrome: Secondary | ICD-10-CM | POA: Diagnosis not present

## 2021-02-25 DIAGNOSIS — N2889 Other specified disorders of kidney and ureter: Secondary | ICD-10-CM | POA: Diagnosis not present

## 2021-02-25 DIAGNOSIS — J101 Influenza due to other identified influenza virus with other respiratory manifestations: Secondary | ICD-10-CM | POA: Diagnosis not present

## 2021-02-25 DIAGNOSIS — Z6841 Body Mass Index (BMI) 40.0 and over, adult: Secondary | ICD-10-CM | POA: Diagnosis not present

## 2021-03-02 DIAGNOSIS — Z6841 Body Mass Index (BMI) 40.0 and over, adult: Secondary | ICD-10-CM | POA: Diagnosis not present

## 2021-03-02 DIAGNOSIS — M797 Fibromyalgia: Secondary | ICD-10-CM | POA: Diagnosis not present

## 2021-03-02 DIAGNOSIS — R2689 Other abnormalities of gait and mobility: Secondary | ICD-10-CM | POA: Diagnosis not present

## 2021-03-02 DIAGNOSIS — Z9181 History of falling: Secondary | ICD-10-CM | POA: Diagnosis not present

## 2021-03-02 DIAGNOSIS — E785 Hyperlipidemia, unspecified: Secondary | ICD-10-CM | POA: Diagnosis not present

## 2021-03-02 DIAGNOSIS — K436 Other and unspecified ventral hernia with obstruction, without gangrene: Secondary | ICD-10-CM | POA: Diagnosis not present

## 2021-03-02 DIAGNOSIS — I10 Essential (primary) hypertension: Secondary | ICD-10-CM | POA: Diagnosis not present

## 2021-03-02 DIAGNOSIS — G2581 Restless legs syndrome: Secondary | ICD-10-CM | POA: Diagnosis not present

## 2021-03-02 DIAGNOSIS — J101 Influenza due to other identified influenza virus with other respiratory manifestations: Secondary | ICD-10-CM | POA: Diagnosis not present

## 2021-03-02 DIAGNOSIS — Z7952 Long term (current) use of systemic steroids: Secondary | ICD-10-CM | POA: Diagnosis not present

## 2021-03-02 DIAGNOSIS — F32A Depression, unspecified: Secondary | ICD-10-CM | POA: Diagnosis not present

## 2021-03-02 DIAGNOSIS — I4892 Unspecified atrial flutter: Secondary | ICD-10-CM | POA: Diagnosis not present

## 2021-03-02 DIAGNOSIS — G629 Polyneuropathy, unspecified: Secondary | ICD-10-CM | POA: Diagnosis not present

## 2021-03-02 DIAGNOSIS — R5381 Other malaise: Secondary | ICD-10-CM | POA: Diagnosis not present

## 2021-03-02 DIAGNOSIS — N2889 Other specified disorders of kidney and ureter: Secondary | ICD-10-CM | POA: Diagnosis not present

## 2021-03-09 DIAGNOSIS — K436 Other and unspecified ventral hernia with obstruction, without gangrene: Secondary | ICD-10-CM | POA: Diagnosis not present

## 2021-03-09 DIAGNOSIS — I4892 Unspecified atrial flutter: Secondary | ICD-10-CM | POA: Diagnosis not present

## 2021-03-09 DIAGNOSIS — Z7952 Long term (current) use of systemic steroids: Secondary | ICD-10-CM | POA: Diagnosis not present

## 2021-03-09 DIAGNOSIS — Z9181 History of falling: Secondary | ICD-10-CM | POA: Diagnosis not present

## 2021-03-09 DIAGNOSIS — N2889 Other specified disorders of kidney and ureter: Secondary | ICD-10-CM | POA: Diagnosis not present

## 2021-03-09 DIAGNOSIS — R2689 Other abnormalities of gait and mobility: Secondary | ICD-10-CM | POA: Diagnosis not present

## 2021-03-09 DIAGNOSIS — J101 Influenza due to other identified influenza virus with other respiratory manifestations: Secondary | ICD-10-CM | POA: Diagnosis not present

## 2021-03-09 DIAGNOSIS — R5381 Other malaise: Secondary | ICD-10-CM | POA: Diagnosis not present

## 2021-03-09 DIAGNOSIS — F32A Depression, unspecified: Secondary | ICD-10-CM | POA: Diagnosis not present

## 2021-03-09 DIAGNOSIS — G2581 Restless legs syndrome: Secondary | ICD-10-CM | POA: Diagnosis not present

## 2021-03-09 DIAGNOSIS — G629 Polyneuropathy, unspecified: Secondary | ICD-10-CM | POA: Diagnosis not present

## 2021-03-09 DIAGNOSIS — Z6841 Body Mass Index (BMI) 40.0 and over, adult: Secondary | ICD-10-CM | POA: Diagnosis not present

## 2021-03-09 DIAGNOSIS — M797 Fibromyalgia: Secondary | ICD-10-CM | POA: Diagnosis not present

## 2021-03-09 DIAGNOSIS — E785 Hyperlipidemia, unspecified: Secondary | ICD-10-CM | POA: Diagnosis not present

## 2021-03-09 DIAGNOSIS — I10 Essential (primary) hypertension: Secondary | ICD-10-CM | POA: Diagnosis not present

## 2021-03-12 DIAGNOSIS — M353 Polymyalgia rheumatica: Secondary | ICD-10-CM | POA: Diagnosis not present

## 2021-03-12 DIAGNOSIS — R2689 Other abnormalities of gait and mobility: Secondary | ICD-10-CM | POA: Diagnosis not present

## 2021-03-12 DIAGNOSIS — M5430 Sciatica, unspecified side: Secondary | ICD-10-CM | POA: Diagnosis not present

## 2021-03-16 DIAGNOSIS — I10 Essential (primary) hypertension: Secondary | ICD-10-CM | POA: Diagnosis not present

## 2021-03-16 DIAGNOSIS — R5381 Other malaise: Secondary | ICD-10-CM | POA: Diagnosis not present

## 2021-03-16 DIAGNOSIS — N2889 Other specified disorders of kidney and ureter: Secondary | ICD-10-CM | POA: Diagnosis not present

## 2021-03-16 DIAGNOSIS — Z9181 History of falling: Secondary | ICD-10-CM | POA: Diagnosis not present

## 2021-03-16 DIAGNOSIS — Z7952 Long term (current) use of systemic steroids: Secondary | ICD-10-CM | POA: Diagnosis not present

## 2021-03-16 DIAGNOSIS — Z6841 Body Mass Index (BMI) 40.0 and over, adult: Secondary | ICD-10-CM | POA: Diagnosis not present

## 2021-03-16 DIAGNOSIS — J101 Influenza due to other identified influenza virus with other respiratory manifestations: Secondary | ICD-10-CM | POA: Diagnosis not present

## 2021-03-16 DIAGNOSIS — G629 Polyneuropathy, unspecified: Secondary | ICD-10-CM | POA: Diagnosis not present

## 2021-03-16 DIAGNOSIS — G2581 Restless legs syndrome: Secondary | ICD-10-CM | POA: Diagnosis not present

## 2021-03-16 DIAGNOSIS — M797 Fibromyalgia: Secondary | ICD-10-CM | POA: Diagnosis not present

## 2021-03-16 DIAGNOSIS — I4892 Unspecified atrial flutter: Secondary | ICD-10-CM | POA: Diagnosis not present

## 2021-03-16 DIAGNOSIS — R2689 Other abnormalities of gait and mobility: Secondary | ICD-10-CM | POA: Diagnosis not present

## 2021-03-16 DIAGNOSIS — E785 Hyperlipidemia, unspecified: Secondary | ICD-10-CM | POA: Diagnosis not present

## 2021-03-16 DIAGNOSIS — F32A Depression, unspecified: Secondary | ICD-10-CM | POA: Diagnosis not present

## 2021-03-16 DIAGNOSIS — K436 Other and unspecified ventral hernia with obstruction, without gangrene: Secondary | ICD-10-CM | POA: Diagnosis not present

## 2021-03-18 DIAGNOSIS — Z7952 Long term (current) use of systemic steroids: Secondary | ICD-10-CM | POA: Diagnosis not present

## 2021-03-18 DIAGNOSIS — F32A Depression, unspecified: Secondary | ICD-10-CM | POA: Diagnosis not present

## 2021-03-18 DIAGNOSIS — N2889 Other specified disorders of kidney and ureter: Secondary | ICD-10-CM | POA: Diagnosis not present

## 2021-03-18 DIAGNOSIS — I10 Essential (primary) hypertension: Secondary | ICD-10-CM | POA: Diagnosis not present

## 2021-03-18 DIAGNOSIS — K436 Other and unspecified ventral hernia with obstruction, without gangrene: Secondary | ICD-10-CM | POA: Diagnosis not present

## 2021-03-18 DIAGNOSIS — M797 Fibromyalgia: Secondary | ICD-10-CM | POA: Diagnosis not present

## 2021-03-18 DIAGNOSIS — R2689 Other abnormalities of gait and mobility: Secondary | ICD-10-CM | POA: Diagnosis not present

## 2021-03-18 DIAGNOSIS — I4892 Unspecified atrial flutter: Secondary | ICD-10-CM | POA: Diagnosis not present

## 2021-03-18 DIAGNOSIS — G629 Polyneuropathy, unspecified: Secondary | ICD-10-CM | POA: Diagnosis not present

## 2021-03-18 DIAGNOSIS — J101 Influenza due to other identified influenza virus with other respiratory manifestations: Secondary | ICD-10-CM | POA: Diagnosis not present

## 2021-03-18 DIAGNOSIS — Z9181 History of falling: Secondary | ICD-10-CM | POA: Diagnosis not present

## 2021-03-18 DIAGNOSIS — G2581 Restless legs syndrome: Secondary | ICD-10-CM | POA: Diagnosis not present

## 2021-03-18 DIAGNOSIS — R5381 Other malaise: Secondary | ICD-10-CM | POA: Diagnosis not present

## 2021-03-18 DIAGNOSIS — Z6841 Body Mass Index (BMI) 40.0 and over, adult: Secondary | ICD-10-CM | POA: Diagnosis not present

## 2021-03-18 DIAGNOSIS — E785 Hyperlipidemia, unspecified: Secondary | ICD-10-CM | POA: Diagnosis not present

## 2021-04-07 DIAGNOSIS — E559 Vitamin D deficiency, unspecified: Secondary | ICD-10-CM | POA: Diagnosis not present

## 2021-04-07 DIAGNOSIS — I509 Heart failure, unspecified: Secondary | ICD-10-CM | POA: Diagnosis not present

## 2021-04-07 DIAGNOSIS — K219 Gastro-esophageal reflux disease without esophagitis: Secondary | ICD-10-CM | POA: Diagnosis not present

## 2021-04-07 DIAGNOSIS — I10 Essential (primary) hypertension: Secondary | ICD-10-CM | POA: Diagnosis not present

## 2021-05-05 DIAGNOSIS — E875 Hyperkalemia: Secondary | ICD-10-CM | POA: Diagnosis not present

## 2021-05-05 DIAGNOSIS — I10 Essential (primary) hypertension: Secondary | ICD-10-CM | POA: Diagnosis not present

## 2021-05-05 DIAGNOSIS — I509 Heart failure, unspecified: Secondary | ICD-10-CM | POA: Diagnosis not present

## 2021-06-08 DIAGNOSIS — I480 Paroxysmal atrial fibrillation: Secondary | ICD-10-CM | POA: Diagnosis not present

## 2021-06-08 DIAGNOSIS — I509 Heart failure, unspecified: Secondary | ICD-10-CM | POA: Diagnosis not present

## 2021-06-08 DIAGNOSIS — N183 Chronic kidney disease, stage 3 unspecified: Secondary | ICD-10-CM | POA: Diagnosis not present

## 2021-06-08 DIAGNOSIS — I130 Hypertensive heart and chronic kidney disease with heart failure and stage 1 through stage 4 chronic kidney disease, or unspecified chronic kidney disease: Secondary | ICD-10-CM | POA: Diagnosis not present

## 2021-06-21 DIAGNOSIS — I509 Heart failure, unspecified: Secondary | ICD-10-CM | POA: Diagnosis not present

## 2021-06-21 DIAGNOSIS — M1990 Unspecified osteoarthritis, unspecified site: Secondary | ICD-10-CM | POA: Diagnosis not present

## 2021-06-21 DIAGNOSIS — I10 Essential (primary) hypertension: Secondary | ICD-10-CM | POA: Diagnosis not present

## 2021-06-21 DIAGNOSIS — K219 Gastro-esophageal reflux disease without esophagitis: Secondary | ICD-10-CM | POA: Diagnosis not present
